# Patient Record
Sex: Female | Born: 1985 | Race: Black or African American | Hispanic: No | Marital: Single | Smoking: Former smoker
Health system: Southern US, Community
[De-identification: ages and names within clinical notes are randomized; demographics above are authoritative.]

## PROBLEM LIST (undated history)

## (undated) DIAGNOSIS — G4730 Sleep apnea, unspecified: Secondary | ICD-10-CM

---
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
## 2014-03-20 ENCOUNTER — Emergency Department (HOSPITAL_COMMUNITY)
Admission: EM | Admit: 2014-03-20 | Discharge: 2014-03-20 | Disposition: A | Discharge status: Home / Self Care | Payer: Self-pay | Attending: Emergency Medicine | Admitting: Emergency Medicine

## 2014-03-20 ENCOUNTER — Emergency Department (HOSPITAL_COMMUNITY): Payer: Self-pay

## 2014-03-20 ENCOUNTER — Encounter (HOSPITAL_COMMUNITY): Payer: Self-pay | Admitting: Emergency Medicine

## 2014-03-20 DIAGNOSIS — J989 Respiratory disorder, unspecified: Secondary | ICD-10-CM

## 2014-03-20 DIAGNOSIS — Z720 Tobacco use: Secondary | ICD-10-CM | POA: Insufficient documentation

## 2014-03-20 DIAGNOSIS — R6889 Other general symptoms and signs: Secondary | ICD-10-CM

## 2014-03-20 DIAGNOSIS — J069 Acute upper respiratory infection, unspecified: Secondary | ICD-10-CM | POA: Insufficient documentation

## 2014-03-20 MED ORDER — HYDROCODONE-HOMATROPINE 5-1.5 MG/5ML PO SYRP: 5.0000 mL | ORAL_SOLUTION | Freq: Four times a day (QID) | ORAL | Status: DC | PRN

## 2014-03-20 MED ORDER — IBUPROFEN 800 MG PO TABS
800.0000 mg | ORAL_TABLET | Freq: Once | ORAL | Status: AC
  Administered 2014-03-20: 800 mg via ORAL
  Filled 2014-03-20: qty 1

## 2014-03-20 MED ORDER — HYDROCODONE-HOMATROPINE 5-1.5 MG/5ML PO SYRP
5.0000 mL | ORAL_SOLUTION | Freq: Once | ORAL | Status: AC
  Administered 2014-03-20: 5 mL via ORAL
  Filled 2014-03-20: qty 5

## 2014-03-20 MED ORDER — ALBUTEROL SULFATE HFA 108 (90 BASE) MCG/ACT IN AERS
2.0000 | INHALATION_SPRAY | Freq: Once | RESPIRATORY_TRACT | Status: AC
  Administered 2014-03-20: 2 via RESPIRATORY_TRACT
  Filled 2014-03-20: qty 6.7

## 2014-03-20 MED ORDER — ACETAMINOPHEN 500 MG PO TABS: 500.0000 mg | ORAL_TABLET | Freq: Four times a day (QID) | ORAL | Status: DC | PRN

## 2014-03-20 MED ORDER — IBUPROFEN 600 MG PO TABS: 600.0000 mg | ORAL_TABLET | Freq: Four times a day (QID) | ORAL | Status: DC | PRN

## 2014-03-20 NOTE — ED Notes (Signed)
Pt reports cold like symptoms x 2 weeks associated with fever and chills. Pt reports productive cough with green sputum. Pt last took tylenol at 0045 for fever.

## 2014-03-20 NOTE — ED Provider Notes (Signed)
CSN: [id number]     Arrival date & time 03/20/14  0250 History   First MD Initiated Contact with Patient 03/20/14 0314     Chief Complaint  Patient presents with  . Nasal Congestion    The patient said she started having cold like symptoms two weeks ago.  The patient does have a productive cough.  . Cough  . Fever     (Consider location/radiation/quality/duration/timing/severity/associated sxs/prior Treatment) HPI Comments: Patient is a 28 yo F past medical history significant for tobacco abuse presenting to the emergency department for 2 weeks of intermittent fevers, chills, productive cough, nasal congestion, rhinorrhea. Max Temperature is 101.3 Fahrenheit. She has tried nighttime cough and cold medication and Tylenol for her symptoms with minimal improvement. Denies any sick contacts. Denies any nausea, vomiting, diarrhea, abdominal pain.  Patient is a [age] y.o. female presenting with cough and fever.  Cough Associated symptoms: chills, fever and rhinorrhea   Fever Associated symptoms: chills, congestion, cough and rhinorrhea   Associated symptoms: no nausea and no vomiting     History reviewed. No pertinent past medical history. Past Surgical History  Procedure Laterality Date  . Breast lumpectomy     History reviewed. No pertinent family history. History  Substance Use Topics  . Smoking status: Current Some Day Smoker -- 0.50 packs/day  . Smokeless tobacco: Never Used  . Alcohol Use: Yes   OB History    No data available     Review of Systems  Constitutional: Positive for fever and chills.  HENT: Positive for congestion and rhinorrhea.   Respiratory: Positive for cough.   Gastrointestinal: Negative for nausea, vomiting and abdominal pain.  All other systems reviewed and are negative.     Allergies  Review of patient's allergies indicates no known allergies.  Home Medications   Prior to Admission medications   Medication Sig Start Date End Date Taking?  Authorizing Provider  acetaminophen (TYLENOL) 325 MG tablet Take 650 mg by mouth every 6 (six) hours as needed for mild pain.   Yes Historical Provider, MD   BP 127/86 mmHg  Pulse 88  Temp(Src) 98.4 F (36.9 C) (Oral)  Resp 16  SpO2 99%  LMP 03/13/2014 Physical Exam  Constitutional: She is oriented to person, place, and time. She appears well-developed and well-nourished. No distress.  HENT:  Head: Normocephalic and atraumatic.  Right Ear: Hearing, tympanic membrane, external ear and ear canal normal.  Left Ear: Hearing, tympanic membrane, external ear and ear canal normal.  Nose: Nose normal.  Mouth/Throat: Oropharynx is clear and moist. No oropharyngeal exudate.  Eyes: Conjunctivae are normal.  Neck: Normal range of motion. Neck supple.  Cardiovascular: Normal rate, regular rhythm and normal heart sounds.   Pulmonary/Chest: Effort normal and breath sounds normal. No respiratory distress. She has no wheezes. She has no rales. She exhibits no tenderness.  Abdominal: Soft. There is no tenderness.  Musculoskeletal: Normal range of motion.  Lymphadenopathy:    She has no cervical adenopathy.  Neurological: She is alert and oriented to person, place, and time.  Skin: Skin is warm and dry. She is not diaphoretic.  Psychiatric: She has a normal mood and affect.  Nursing note and vitals reviewed.   ED Course  Procedures (including critical care time) Medications  ibuprofen (ADVIL,MOTRIN) tablet 800 mg (800 mg Oral Given 03/20/14 0320)    Labs Review Labs Reviewed - No data to display  Imaging Review Dg Chest 2 View  03/20/2014   CLINICAL DATA:  Congestion, fever,  cough and headache.  EXAM: CHEST  2 VIEW  COMPARISON:  None.  FINDINGS: Heart size and mediastinal contours are within normal limits. Both lungs are clear. Visualized skeletal structures are unremarkable.  IMPRESSION: Negative exam.   Electronically Signed   By: Drusilla Kannerhomas  Dalessio M.D.   On: 03/20/2014 03:45     EKG  Interpretation None      MDM   Final diagnoses:  Respiratory illness    Filed Vitals:   03/20/14 0413  BP: 112/83  Pulse: 81  Temp:   Resp:     Afebrile, NAD, non-toxic appearing, AAOx4.  Patient with symptoms consistent with influenza.  Vitals are stable, low-grade fever at home.  No signs of dehydration, tolerating PO's.  Lungs are clear. Due to patient's presentation and physical exam a chest x-ray was not ordered bc likely diagnosis of flu.  Discussed the cost versus benefit of Tamiflu treatment with the patient.  The patient understands that symptoms are greater than the recommended 24-48 hour window of treatment.  Patient will be discharged with instructions to orally hydrate, rest, and use over-the-counter medications such as anti-inflammatories ibuprofen and Aleve for muscle aches and Tylenol for fever.  Patient will also be given a cough suppressant. Return precautions discussed. Patient is agreeable to plan. Patient stable at time of discharge.     Jeannetta EllisJennifer L Corri Delapaz, PA-C 03/20/14 [phone]  Shon Batonourtney F Horton, MD 03/20/14 [id number]

## 2014-03-20 NOTE — Discharge Instructions (Signed)
Please follow up with your primary care physician in 1-2 days. If you do not have one please call the [facility] number listed above. Please alternate between Motrin and Tylenol every three hours for fevers and pain. Please use your inhaler 1-2 puffs every 4-6 hours to help with cough or chest tightness. Please take Hycodan as prescribed. Please be aware this medication may make you sleepy or drowsy and he should not drive after taking it. Please read all discharge instructions and return precautions.   Influenza Influenza ("the flu") is a viral infection of the respiratory tract. It occurs more often in winter months because people spend more time in close contact with one another. Influenza can make you feel very sick. Influenza easily spreads from person to person (contagious). CAUSES  Influenza is caused by a virus that infects the respiratory tract. You can catch the virus by breathing in droplets from an infected person's cough or sneeze. You can also catch the virus by touching something that was recently contaminated with the virus and then touching your mouth, nose, or eyes. RISKS AND COMPLICATIONS You may be at risk for a more severe case of influenza if you smoke cigarettes, have diabetes, have chronic heart disease (such as heart failure) or lung disease (such as asthma), or if you have a weakened immune system. Elderly people and pregnant women are also at risk for more serious infections. The most common problem of influenza is a lung infection (pneumonia). Sometimes, this problem can require emergency medical care and may be life threatening. SIGNS AND SYMPTOMS  Symptoms typically last 4 to 10 days and may include:  Fever.  Chills.  Headache, body aches, and muscle aches.  Sore throat.  Chest discomfort and cough.  Poor appetite.  Weakness or feeling tired.  Dizziness.  Nausea or vomiting. DIAGNOSIS  Diagnosis of influenza is often made based on your  history and a physical exam. A nose or throat swab test can be done to confirm the diagnosis. TREATMENT  In mild cases, influenza goes away on its own. Treatment is directed at relieving symptoms. For more severe cases, your health care provider may prescribe antiviral medicines to shorten the sickness. Antibiotic medicines are not effective because the infection is caused by a virus, not by bacteria. HOME CARE INSTRUCTIONS  Take medicines only as directed by your health care provider.  Use a cool mist humidifier to make breathing easier.  Get plenty of rest until your temperature returns to normal. This usually takes 3 to 4 days.  Drink enough fluid to keep your urine clear or pale yellow.  Cover yourmouth and nosewhen coughing or sneezing,and wash your handswellto prevent thevirusfrom spreading.  Stay homefromwork orschool untilthe fever is gonefor at least 451full day. PREVENTION  An annual influenza vaccination (flu shot) is the best way to avoid getting influenza. An annual flu shot is now routinely recommended for all adults in the [place] SEEK MEDICAL CARE IF:  You experiencechest pain, yourcough worsens,or you producemore mucus.  Youhave nausea,vomiting, ordiarrhea.  Your fever returns or gets worse. SEEK IMMEDIATE MEDICAL CARE IF:  You havetrouble breathing, you become short of breath,or your skin ornails becomebluish.  You have severe painor stiffnessin the neck.  You develop a sudden headache, or pain in the face or ear.  You have nausea or vomiting that you cannot control. MAKE SURE YOU:   Understand these instructions.  Will watch your condition.  Will get help right away if you are  not doing well or get worse. Document Released: 04/23/2000 Document Revised: 09/10/2013 Document Reviewed: 07/26/2011 [facility] Patient Information 2015 [employer] [place]. This information is not intended to replace advice given to you by your health care provider.  Make sure you discuss any questions you have with your health care provider.

## 2014-03-20 NOTE — ED Notes (Signed)
The patient said she started having cold like symptoms two weeks ago.  The patient does have a productive cough.  She says she has  green, thick sputum when she coughs.  She did take nighttime cold medication and tylenol for a fever of 101.3.  Her fever has come down to 98.4.  She is here to be evaluated.

## 2014-04-30 ENCOUNTER — Emergency Department (HOSPITAL_COMMUNITY): Payer: No Typology Code available for payment source

## 2014-04-30 ENCOUNTER — Encounter (HOSPITAL_COMMUNITY): Payer: Self-pay | Admitting: Emergency Medicine

## 2014-04-30 ENCOUNTER — Emergency Department (HOSPITAL_COMMUNITY)
Admission: EM | Admit: 2014-04-30 | Discharge: 2014-04-30 | Disposition: A | Discharge status: Home / Self Care | Payer: Self-pay | Attending: Emergency Medicine | Admitting: Emergency Medicine

## 2014-04-30 ENCOUNTER — Emergency Department (HOSPITAL_COMMUNITY): Payer: Self-pay

## 2014-04-30 DIAGNOSIS — Z79899 Other long term (current) drug therapy: Secondary | ICD-10-CM | POA: Insufficient documentation

## 2014-04-30 DIAGNOSIS — Z720 Tobacco use: Secondary | ICD-10-CM | POA: Insufficient documentation

## 2014-04-30 DIAGNOSIS — S3992XA Unspecified injury of lower back, initial encounter: Secondary | ICD-10-CM | POA: Insufficient documentation

## 2014-04-30 DIAGNOSIS — Z791 Long term (current) use of non-steroidal anti-inflammatories (NSAID): Secondary | ICD-10-CM | POA: Insufficient documentation

## 2014-04-30 DIAGNOSIS — Y92410 Unspecified street and highway as the place of occurrence of the external cause: Secondary | ICD-10-CM | POA: Insufficient documentation

## 2014-04-30 DIAGNOSIS — M6281 Muscle weakness (generalized): Secondary | ICD-10-CM | POA: Insufficient documentation

## 2014-04-30 DIAGNOSIS — Y9389 Activity, other specified: Secondary | ICD-10-CM | POA: Insufficient documentation

## 2014-04-30 DIAGNOSIS — Y998 Other external cause status: Secondary | ICD-10-CM | POA: Insufficient documentation

## 2014-04-30 DIAGNOSIS — M549 Dorsalgia, unspecified: Secondary | ICD-10-CM

## 2014-04-30 LAB — I-STAT BETA HCG BLOOD, ED (MC, WL, AP ONLY): I-stat hCG, quantitative: <5 m[IU]/mL (ref <5)

## 2014-04-30 MED ORDER — MORPHINE SULFATE 4 MG/ML IJ SOLN
4.0000 mg | Freq: Once | INTRAMUSCULAR | Status: AC
  Administered 2014-04-30: 4 mg via INTRAVENOUS
  Filled 2014-04-30: qty 1

## 2014-04-30 MED ORDER — HYDROCODONE-ACETAMINOPHEN 5-325 MG PO TABS: 1.0000 | ORAL_TABLET | ORAL | Status: DC | PRN

## 2014-04-30 MED ORDER — CYCLOBENZAPRINE HCL 10 MG PO TABS: 10.0000 mg | ORAL_TABLET | Freq: Two times a day (BID) | ORAL | Status: DC | PRN

## 2014-04-30 MED ORDER — NAPROXEN 500 MG PO TABS: 500.0000 mg | ORAL_TABLET | Freq: Two times a day (BID) | ORAL | Status: DC

## 2014-04-30 MED ORDER — ONDANSETRON HCL 4 MG/2ML IJ SOLN
4.0000 mg | Freq: Once | INTRAMUSCULAR | Status: AC
  Administered 2014-04-30: 4 mg via INTRAVENOUS
  Filled 2014-04-30: qty 2

## 2014-04-30 NOTE — ED Notes (Signed)
Patient transported to X-ray without distress; pain easing off. Glass removed from under pt's back.
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
## 2014-04-30 NOTE — ED Notes (Signed)
Pt walked to restroom without distress or assistance.

## 2014-04-30 NOTE — ED Notes (Signed)
Pt resting in bed sitting up.
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
## 2014-04-30 NOTE — ED Notes (Signed)
Pt returned from scans without distress; pain improved.
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
## 2014-04-30 NOTE — ED Notes (Signed)
PT ambulated with baseline gait; VSS; A&Ox3; no signs of distress; respirations even and unlabored; skin warm and dry; no questions upon discharge.  

## 2014-04-30 NOTE — ED Notes (Signed)
PA at bedside.

## 2014-12-19 ENCOUNTER — Encounter (HOSPITAL_BASED_OUTPATIENT_CLINIC_OR_DEPARTMENT_OTHER): Payer: Self-pay | Admitting: *Deleted

## 2014-12-19 ENCOUNTER — Emergency Department (HOSPITAL_BASED_OUTPATIENT_CLINIC_OR_DEPARTMENT_OTHER)
Admission: EM | Admit: 2014-12-19 | Discharge: 2014-12-19 | Disposition: A | Discharge status: Home / Self Care | Payer: Self-pay | Attending: Emergency Medicine | Admitting: Emergency Medicine

## 2014-12-19 DIAGNOSIS — R519 Headache, unspecified: Secondary | ICD-10-CM

## 2014-12-19 DIAGNOSIS — Z791 Long term (current) use of non-steroidal anti-inflammatories (NSAID): Secondary | ICD-10-CM | POA: Insufficient documentation

## 2014-12-19 DIAGNOSIS — R51 Headache: Secondary | ICD-10-CM | POA: Insufficient documentation

## 2014-12-19 DIAGNOSIS — Z720 Tobacco use: Secondary | ICD-10-CM | POA: Insufficient documentation

## 2014-12-19 MED ORDER — KETOROLAC TROMETHAMINE 30 MG/ML IJ SOLN
30.0000 mg | Freq: Once | INTRAMUSCULAR | Status: AC
  Administered 2014-12-19: 30 mg via INTRAVENOUS
  Filled 2014-12-19: qty 1

## 2014-12-19 MED ORDER — LORAZEPAM 2 MG/ML IJ SOLN
1.0000 mg | Freq: Once | INTRAMUSCULAR | Status: DC
  Filled 2014-12-19: qty 1

## 2014-12-19 MED ORDER — DIPHENHYDRAMINE HCL 50 MG/ML IJ SOLN
25.0000 mg | Freq: Once | INTRAMUSCULAR | Status: AC
  Administered 2014-12-19: 25 mg via INTRAVENOUS
  Filled 2014-12-19: qty 1

## 2014-12-19 MED ORDER — SODIUM CHLORIDE 0.9 % IV BOLUS (SEPSIS)
1000.0000 mL | Freq: Once | INTRAVENOUS | Status: AC
  Administered 2014-12-19: 1000 mL via INTRAVENOUS

## 2014-12-19 MED ORDER — PROCHLORPERAZINE EDISYLATE 5 MG/ML IJ SOLN
10.0000 mg | Freq: Once | INTRAMUSCULAR | Status: AC
  Administered 2014-12-19: 10 mg via INTRAVENOUS
  Filled 2014-12-19: qty 2

## 2014-12-19 NOTE — ED Notes (Signed)
Patient states she has had a migraine headache since yesterday.  States she has a history of the same and feels like her typical migraine.

## 2014-12-19 NOTE — ED Notes (Signed)
D/c home with friend to drive- note for work given

## 2014-12-19 NOTE — Discharge Instructions (Signed)
You are having a headache. No specific cause was found today for your headache. It may have been a migraine or other cause of headache. Stress, anxiety, fatigue, and depression are common triggers for headaches. Your headache today does not appear to be life-threatening or require hospitalization, but often the exact cause of headaches is not determined in the emergency department. Therefore, follow-up with your doctor is very important to find out what may have caused your headache, and whether or not you need any further diagnostic testing or treatment. Sometimes headaches can appear benign (not harmful), but then more serious symptoms can develop which should prompt an immediate re-evaluation by your doctor or the emergency department. °SEEK MEDICAL ATTENTION IF: °You develop possible problems with medications prescribed.  °The medications don't resolve your headache, if it recurs , or if you have multiple episodes of vomiting or can't take fluids. °You have a change from the usual headache. °RETURN IMMEDIATELY IF you develop a sudden, severe headache or confusion, become poorly responsive or faint, develop a fever above 100.4F or problem breathing, have a change in speech, vision, swallowing, or understanding, or develop new weakness, numbness, tingling, incoordination, or have a seizure. ° ° °Migraine Headache °A migraine headache is an intense, throbbing pain on one or both sides of your head. A migraine can last for 30 minutes to several hours. °CAUSES  °The exact cause of a migraine headache is not always known. However, a migraine may be caused when nerves in the brain become irritated and release chemicals that cause inflammation. This causes pain. °Certain things may also trigger migraines, such as: °· Alcohol. °· Smoking. °· Stress. °· Menstruation. °· Aged cheeses. °· Foods or drinks that contain nitrates, glutamate, aspartame, or tyramine. °· Lack of  sleep. °· Chocolate. °· Caffeine. °· Hunger. °· Physical exertion. °· Fatigue. °· Medicines used to treat chest pain (nitroglycerine), birth control pills, estrogen, and some blood pressure medicines. °SIGNS AND SYMPTOMS °· Pain on one or both sides of your head. °· Pulsating or throbbing pain. °· Severe pain that prevents daily activities. °· Pain that is aggravated by any physical activity. °· Nausea, vomiting, or both. °· Dizziness. °· Pain with exposure to bright lights, loud noises, or activity. °· General sensitivity to bright lights, loud noises, or smells. °Before you get a migraine, you may get warning signs that a migraine is coming (aura). An aura may include: °· Seeing flashing lights. °· Seeing bright spots, halos, or zigzag lines. °· Having tunnel vision or blurred vision. °· Having feelings of numbness or tingling. °· Having trouble talking. °· Having muscle weakness. °DIAGNOSIS  °A migraine headache is often diagnosed based on: °· Symptoms. °· Physical exam. °· A CT scan or MRI of your head. These imaging tests cannot diagnose migraines, but they can help rule out other causes of headaches. °TREATMENT °Medicines may be given for pain and nausea. Medicines can also be given to help prevent recurrent migraines.  °HOME CARE INSTRUCTIONS °· Only take over-the-counter or prescription medicines for pain or discomfort as directed by your health care provider. The use of long-term narcotics is not recommended. °· Lie down in a dark, quiet room when you have a migraine. °· Keep a journal to find out what may trigger your migraine headaches. For example, write down: °¨ What you eat and drink. °¨ How much sleep you get. °¨ Any change to your diet or medicines. °· Limit alcohol consumption. °· Quit smoking if you smoke. °· Get 7-9 hours   of sleep, or as recommended by your health care provider. °· Limit stress. °· Keep lights dim if bright lights bother you and make your migraines worse. °SEEK IMMEDIATE MEDICAL  CARE IF:  °· Your migraine becomes severe. °· You have a fever. °· You have a stiff neck. °· You have vision loss. °· You have muscular weakness or loss of muscle control. °· You start losing your balance or have trouble walking. °· You feel faint or pass out. °· You have severe symptoms that are different from your first symptoms. °MAKE SURE YOU:  °· Understand these instructions. °· Will watch your condition. °· Will get help right away if you are not doing well or get worse. °Document Released: 04/26/2005 Document Revised: 09/10/2013 Document Reviewed: 01/01/2013 °[employer]® Patient Information ©2015 [employer]. This information is not intended to replace advice given to you by your health care provider. Make sure you discuss any questions you have with your health care provider. ° °

## 2014-12-19 NOTE — ED Provider Notes (Signed)
CSN: [id number]     Arrival date & time 12/19/14  0940 History   First MD Initiated Contact with Patient 12/19/14 1207     Chief Complaint  Patient presents with  . Migraine     (Consider location/radiation/quality/duration/timing/severity/associated sxs/prior Treatment) HPI  SUBJECTIVE:  Deborah Norman is a 29 y.o. female who complains of migraine headache for 2 day(s). She has a well established history of recurrent migraines. Description of pain: throbbing pain, unilateral. Associated symptoms: light sensitivity and nausea. Patient has already taken otc meds for this headache without relief. There are no associated abnormal neurological symptoms such as TIA's, loss of balance, loss of vision or speech, numbness or weakness on review. Past neurological history: negative for stroke, MS, epilepsy, or brain tumor.   Current Facility-Administered Medications  Medication Dose Route Frequency Provider Last Rate Last Dose  . LORazepam (ATIVAN) injection 1 mg  1 mg Intravenous Once Arthor Captain, PA-C   1 mg at 12/19/14 1302   Current Outpatient Prescriptions  Medication Sig Dispense Refill  . acetaminophen (TYLENOL) 500 MG tablet Take 1 tablet (500 mg total) by mouth every 6 (six) hours as needed. 30 tablet 0  . cyclobenzaprine (FLEXERIL) 10 MG tablet Take 1 tablet (10 mg total) by mouth 2 (two) times daily as needed for muscle spasms. 20 tablet 0  . HYDROcodone-acetaminophen (NORCO) 5-325 MG per tablet Take 1 tablet by mouth every 4 (four) hours as needed. 10 tablet 0  . HYDROcodone-homatropine (HYCODAN) 5-1.5 MG/5ML syrup Take 5 mLs by mouth every 6 (six) hours as needed for cough. (Patient not taking: Reported on 04/30/2014) 120 mL 0  . ibuprofen (ADVIL,MOTRIN) 600 MG tablet Take 1 tablet (600 mg total) by mouth every 6 (six) hours as needed. 30 tablet 0  . naproxen (NAPROSYN) 500 MG tablet Take 1 tablet (500 mg total) by mouth 2 (two) times daily with a meal. 30 tablet 0      History  reviewed. No pertinent past medical history. Past Surgical History  Procedure Laterality Date  . Breast lumpectomy     No family history on file. Social History  Substance Use Topics  . Smoking status: Current Some Day Smoker -- 0.50 packs/day  . Smokeless tobacco: Never Used  . Alcohol Use: Yes   OB History    No data available     Review of Systems  Ten systems reviewed and are negative for acute change, except as noted in the HPI.    Allergies  Review of patient's allergies indicates no known allergies.  Home Medications   Prior to Admission medications   Medication Sig Start Date End Date Taking? Authorizing Provider  acetaminophen (TYLENOL) 500 MG tablet Take 1 tablet (500 mg total) by mouth every 6 (six) hours as needed. 03/20/14  Yes Jennifer Piepenbrink, PA-C  cyclobenzaprine (FLEXERIL) 10 MG tablet Take 1 tablet (10 mg total) by mouth 2 (two) times daily as needed for muscle spasms. 04/30/14   Arthor Captain, PA-C  HYDROcodone-acetaminophen (NORCO) 5-325 MG per tablet Take 1 tablet by mouth every 4 (four) hours as needed. 04/30/14   Arthor Captain, PA-C  HYDROcodone-homatropine (HYCODAN) 5-1.5 MG/5ML syrup Take 5 mLs by mouth every 6 (six) hours as needed for cough. Patient not taking: Reported on 04/30/2014 03/20/14   Francee Piccolo, PA-C  ibuprofen (ADVIL,MOTRIN) 600 MG tablet Take 1 tablet (600 mg total) by mouth every 6 (six) hours as needed. 03/20/14   Jennifer Piepenbrink, PA-C  naproxen (NAPROSYN) 500 MG tablet Take 1 tablet (  500 mg total) by mouth 2 (two) times daily with a meal. 04/30/14   Arthor Captain, PA-C   BP 132/82 mmHg  Pulse 68  Temp(Src) 98.2 F (36.8 C) (Oral)  Resp 16  Ht 5\' 4"  (1.626 m)  Wt 235 lb (106.595 kg)  BMI 40.32 kg/m2  SpO2 100%  LMP 12/05/2014 (Exact Date) Physical Exam  Constitutional: She is oriented to person, place, and time. She appears well-developed and well-nourished. No distress.  HENT:  Head: Normocephalic and  atraumatic.  Mouth/Throat: Oropharynx is clear and moist.  Eyes: Conjunctivae and EOM are normal. Pupils are equal, round, and reactive to light. No scleral icterus.  No horizontal, vertical or rotational nystagmus  Neck: Normal range of motion. Neck supple.  Full active and passive ROM without pain No midline or paraspinal tenderness No nuchal rigidity or meningeal signs  Cardiovascular: Normal rate, regular rhythm and intact distal pulses.   Pulmonary/Chest: Effort normal and breath sounds normal. No respiratory distress. She has no wheezes. She has no rales.  Abdominal: Soft. Bowel sounds are normal. There is no tenderness. There is no rebound and no guarding.  Musculoskeletal: Normal range of motion.  Lymphadenopathy:    She has no cervical adenopathy.  Neurological: She is alert and oriented to person, place, and time. She has normal reflexes. No cranial nerve deficit. She exhibits normal muscle tone. Coordination normal.  Mental Status:  Alert, oriented, thought content appropriate. Speech fluent without evidence of aphasia. Able to follow 2 step commands without difficulty.  Cranial Nerves:  II:  Peripheral visual fields grossly normal, pupils equal, round, reactive to light III,IV, VI: ptosis not present, extra-ocular motions intact bilaterally  V,VII: smile symmetric, facial light touch sensation equal VIII: hearing grossly normal bilaterally  IX,X: midline uvula rise  XI: bilateral shoulder shrug equal and strong XII: midline tongue extension  Motor:  5/5 in upper and lower extremities bilaterally including strong and equal grip strength and dorsiflexion/plantar flexion Sensory: Pinprick and light touch normal in all extremities.  Deep Tendon Reflexes: 2+ and symmetric  Cerebellar: normal finger-to-nose with bilateral upper extremities Gait: normal gait and balance CV: distal pulses palpable throughout   Skin: Skin is warm and dry. No rash noted. She is not diaphoretic.   Psychiatric: She has a normal mood and affect. Her behavior is normal. Judgment and thought content normal.  Nursing note and vitals reviewed.   ED Course  Procedures (including critical care time) Labs Review Labs Reviewed - No data to display  Imaging Review No results found.   EKG Interpretation None      MDM   Final diagnoses:  Bad headache    Pt HA treated and improved while in ED.  Presentation is like pts typical HA and non concerning for [facility], ICH, Meningitis, or temporal arteritis. Pt is afebrile with no focal neuro deficits, nuchal rigidity, or change in vision. Pt is to follow up with PCP to discuss prophylactic medication. Pt verbalizes understanding and is agreeable with plan to dc.      Arthor Captain, PA-C 12/19/14 1336  Blake Divine, MD 12/19/14 [phone]
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
## 2017-08-09 ENCOUNTER — Ambulatory Visit: Payer: BC Managed Care – PPO | Admitting: Diagnostic Neuroimaging

## 2017-08-09 ENCOUNTER — Encounter: Payer: Self-pay | Admitting: Diagnostic Neuroimaging

## 2017-08-09 VITALS — BP 137/91 | HR 78 | Ht 65.0 in | Wt 246.8 lb

## 2017-08-09 DIAGNOSIS — G4489 Other headache syndrome: Secondary | ICD-10-CM | POA: Diagnosis not present

## 2017-08-09 DIAGNOSIS — H4710 Unspecified papilledema: Secondary | ICD-10-CM

## 2017-08-09 MED ORDER — ALPRAZOLAM 0.5 MG PO TABS: 0.5000 mg | ORAL_TABLET | ORAL | 0 refills | Status: DC | PRN

## 2017-08-09 NOTE — Progress Notes (Signed)
[facility]  PATIENT: Deborah Norman DOB: [birth year]  REFERRING CLINICIAN: Lewis Moccasin, OD HISTORY FROM: patient and chart review  REASON FOR VISIT: new consult    HISTORICAL  CHIEF COMPLAINT:  Chief Complaint  Patient presents with  . Benign intracranial HTN    rm 7, New Pt, friend- [place]    HISTORY OF PRESENT ILLNESS:   32 year old female here for evaluation of possible pseudotumor cerebri.  Patient presented to eye doctor for routine annual exam.  Due to slight optic nerve head elevation without frank papilledema, patient return for OCT testing.  Apparently this showed abnormality concerning for pseudotumor cerebri and therefore patient was referred here for further evaluation.  Patient does have some mild intermittent headaches, 2-3 times per month, with nausea, vomiting, photophobia and phonophobia.  Headaches can last 1-3 days at a time.  Patient had evaluation in 2014 for possible "migraine headaches".  Patient has had some intermittent ringing in the ears.  She has also had 30 pound weight gain in the past 5-6 months.  No numbness or tingling.  No balance or gait difficulty.  No slurred speech.   REVIEW OF SYSTEMS: Full 14 system review of systems performed and negative with exception of: Weight gain eye pain headache snoring dizziness anxiety increased thirst runny nose cough swelling in feet.  ALLERGIES: No Known Allergies  HOME MEDICATIONS: Outpatient Medications Prior to Visit  Medication Sig Dispense Refill  . acetaminophen (TYLENOL) 500 MG tablet Take 1 tablet (500 mg total) by mouth every 6 (six) hours as needed. 30 tablet 0  . cyclobenzaprine (FLEXERIL) 10 MG tablet Take 1 tablet (10 mg total) by mouth 2 (two) times daily as needed for muscle spasms. 20 tablet 0  . ibuprofen (ADVIL,MOTRIN) 600 MG tablet Take 1 tablet (600 mg total) by mouth every 6 (six) hours as needed. 30 tablet 0  . naproxen (NAPROSYN) 500 MG tablet Take 1 tablet  (500 mg total) by mouth 2 (two) times daily with a meal. 30 tablet 0  . HYDROcodone-acetaminophen (NORCO) 5-325 MG per tablet Take 1 tablet by mouth every 4 (four) hours as needed. 10 tablet 0  . HYDROcodone-homatropine (HYCODAN) 5-1.5 MG/5ML syrup Take 5 mLs by mouth every 6 (six) hours as needed for cough. (Patient not taking: Reported on 04/30/2014) 120 mL 0   No facility-administered medications prior to visit.     PAST MEDICAL HISTORY: No past medical history on file.  PAST SURGICAL HISTORY: Past Surgical History:  Procedure Laterality Date  . BREAST LUMPECTOMY Right 2003   benign    FAMILY HISTORY: Family History  Problem Relation Age of Onset  . Heart disease Father   . Hypertension Father   . Depression Father   . Asthma Sister     SOCIAL HISTORY:  Social History   Socioeconomic History  . Marital status: Single    Spouse name: Not on file  . Number of children: Not on file  . Years of education: Not on file  . Highest education level: Not on file  Occupational History  . Not on file  Social Needs  . Financial resource strain: Not on file  . Food insecurity:    Worry: Not on file    Inability: Not on file  . Transportation needs:    Medical: Not on file    Non-medical: Not on file  Tobacco Use  . Smoking status: Current Some Day Smoker    Packs/day: 0.50  . Smokeless tobacco: Never Used  .  Tobacco comment: 08/09/17 1/2 pack > month  Substance and Sexual Activity  . Alcohol use: Yes    Comment: socially  . Drug use: No  . Sexual activity: Never  Lifestyle  . Physical activity:    Days per week: Not on file    Minutes per session: Not on file  . Stress: Not on file  Relationships  . Social connections:    Talks on phone: Not on file    Gets together: Not on file    Attends religious service: Not on file    Active member of club or organization: Not on file    Attends meetings of clubs or organizations: Not on file    Relationship status: Not on  file  . Intimate partner violence:    Fear of current or ex partner: Not on file    Emotionally abused: Not on file    Physically abused: Not on file    Forced sexual activity: Not on file  Other Topics Concern  . Not on file  Social History Narrative   Radio broadcast assistant   Education- 18   Children- none   Caffeine - none     PHYSICAL EXAM  GENERAL EXAM/CONSTITUTIONAL: Vitals:  Vitals:   08/09/17 1550  BP: (!) 137/91  Pulse: 78  Weight: 246 lb 12.8 oz (111.9 kg)  Height: 5\' 5"  (1.651 m)     Body mass index is 41.07 kg/m.  Visual Acuity Screening   Right eye Left eye Both eyes  Without correction:     With correction: 20/30 20/30   Comments: Contact lenses    Patient is in no distress; well developed, nourished and groomed; neck is supple  CARDIOVASCULAR:  Examination of carotid arteries is normal; no carotid bruits  Regular rate and rhythm, no murmurs  Examination of peripheral vascular system by observation and palpation is normal  EYES:  Ophthalmoscopic exam of optic discs and posterior segments is normal; no papilledema or hemorrhages  MUSCULOSKELETAL:  Gait, strength, tone, movements noted in Neurologic exam below  NEUROLOGIC: MENTAL STATUS:  No flowsheet data found.  awake, alert, oriented to person, place and time  recent and remote memory intact  normal attention and concentration  language fluent, comprehension intact, naming intact,   fund of knowledge appropriate  CRANIAL NERVE:   2nd - no papilledema on fundoscopic exam  2nd, 3rd, 4th, 6th - pupils equal and reactive to light, visual fields full to confrontation, extraocular muscles intact, no nystagmus  5th - facial sensation symmetric  7th - facial strength symmetric  8th - hearing intact  9th - palate elevates symmetrically, uvula midline  11th - shoulder shrug symmetric  12th - tongue protrusion midline  MOTOR:   normal bulk and tone, full strength in the BUE,  BLE  SENSORY:   normal and symmetric to light touch, temperature, vibration  COORDINATION:   finger-nose-finger, fine finger movements normal  REFLEXES:   deep tendon reflexes TRACE and symmetric  GAIT/STATION:   narrow based gait    DIAGNOSTIC DATA (LABS, IMAGING, TESTING) - I reviewed patient records, labs, notes, testing and imaging myself where available.  No results found for: WBC, HGB, HCT, MCV, PLT No results found for: NA, K, CL, CO2, GLUCOSE, BUN, CREATININE, CALCIUM, PROT, ALBUMIN, AST, ALT, ALKPHOS, BILITOT, GFRNONAA, GFRAA No results found for: CHOL, HDL, LDLCALC, LDLDIRECT, TRIG, CHOLHDL No results found for: UJWJ1B No results found for: VITAMINB12 No results found for: TSH       ASSESSMENT AND  PLAN  32 y.o. year old female here with migraine headaches without aura, weight gain, now found to have possible papilledema.  Will proceed with further workup for idiopathic intracranial hypertension (pseudotumor cerebri).    Dx:  1. Papilledema   2. Other headache syndrome      PLAN:  HEADACHES / VISION CHANGES / ? PAPILLEDEMA - check MRI brain w/wo --> if negative, then may consider MRI orbits in future - then may consider LP to measure opening pressure - reviewed nutrition, exercise, weight loss  Orders Placed This Encounter  Procedures  . MR BRAIN W WO CONTRAST   Meds ordered this encounter  Medications  . ALPRAZolam (XANAX) 0.5 MG tablet    Sig: Take 1 tablet (0.5 mg total) by mouth as needed for anxiety (for sedation before MRI scan; take 1 hour before scan; may repeat 15 min before scan).    Dispense:  3 tablet    Refill:  0   Return pending test results.  I reviewed images, labs, notes, records myself. I summarized findings and reviewed with patient, for this high risk condition (papilledema, vision changes, headaches) requiring high complexity decision making.    Suanne MarkerVIKRAM R. Knowledge Escandon, MD 08/09/2017, 4:22 PM Certified in Neurology,  Neurophysiology and Neuroimaging  [facility] [address], [address] [place], [place] [address] [phone]

## 2017-08-09 NOTE — Patient Instructions (Signed)
-   check MRI brain 

## 2017-08-10 ENCOUNTER — Telehealth: Payer: Self-pay | Admitting: Diagnostic Neuroimaging

## 2017-08-10 NOTE — Telephone Encounter (Signed)
Noted  

## 2017-08-10 NOTE — Telephone Encounter (Signed)
Just an fyi I spoke tot he patient I informed her it would cost her about $1,328.85 because of her deductible not being met. I did offer the payment plan and I even told her when she came in to have the MRI she would have to pay at least $75.00 she informed me she is not going to do that.

## 2019-01-08 ENCOUNTER — Other Ambulatory Visit: Payer: Self-pay

## 2019-01-08 DIAGNOSIS — Z20822 Contact with and (suspected) exposure to covid-19: Secondary | ICD-10-CM

## 2019-01-09 LAB — NOVEL CORONAVIRUS, NAA: SARS-CoV-2, NAA: NOT DETECTED

## 2019-03-15 ENCOUNTER — Other Ambulatory Visit: Payer: Self-pay

## 2019-03-15 DIAGNOSIS — Z20822 Contact with and (suspected) exposure to covid-19: Secondary | ICD-10-CM

## 2019-03-17 LAB — NOVEL CORONAVIRUS, NAA: SARS-CoV-2, NAA: NOT DETECTED

## 2020-07-17 ENCOUNTER — Other Ambulatory Visit: Payer: Self-pay | Admitting: Surgery

## 2020-07-17 ENCOUNTER — Other Ambulatory Visit (HOSPITAL_COMMUNITY): Payer: Self-pay | Admitting: Surgery

## 2020-07-18 ENCOUNTER — Other Ambulatory Visit (HOSPITAL_BASED_OUTPATIENT_CLINIC_OR_DEPARTMENT_OTHER): Payer: Self-pay

## 2020-08-01 ENCOUNTER — Other Ambulatory Visit: Payer: Self-pay

## 2020-08-01 ENCOUNTER — Ambulatory Visit (HOSPITAL_COMMUNITY)
Admission: RE | Admit: 2020-08-01 | Discharge: 2020-08-01 | Disposition: A | Discharge status: Home / Self Care | Payer: BC Managed Care – PPO | Source: Ambulatory Visit | Attending: Surgery | Admitting: Surgery

## 2020-08-20 ENCOUNTER — Ambulatory Visit: Payer: Self-pay | Admitting: Skilled Nursing Facility1

## 2020-08-27 ENCOUNTER — Other Ambulatory Visit: Payer: Self-pay

## 2020-08-27 ENCOUNTER — Encounter: Payer: BC Managed Care – PPO | Attending: Surgery | Admitting: Skilled Nursing Facility1

## 2020-08-27 DIAGNOSIS — E669 Obesity, unspecified: Secondary | ICD-10-CM | POA: Insufficient documentation

## 2020-08-27 NOTE — Progress Notes (Signed)
Nutrition Assessment for Bariatric Surgery Medical Nutrition Therapy Appt Start Time: 7:40 am    End Time: 8:30am  Patient was seen on 08/27/20 for Pre-Operative Nutrition Assessment. Letter of approval faxed to [facility] Surgery bariatric surgery program coordinator on 08/27/20.   Referral stated Supervised Weight Loss (SWL) visits needed: 0  Not cleared at this time:  Pt to follow up for minimum of one more visit to assist pt with progressing through stages of change. RD advised pt that this follow up visit is not mandated through insurance. Pt verbalized agreement.  Planned surgery: sleeve Pt expectation of surgery: increase endurance Pt expectation of dietitian: none identified    NUTRITION ASSESSMENT   Anthropometrics  Start weight at [facility]: 254 lbs (date: 08/27/20)  Height: 64 in BMI: 42.93 kg/m2     Clinical  Medical hx: bowel movements every 2-3 days, breast lumpectomy at age 58 years Medications:  vitamin d, hydrochlorothiazide (3 times per week), and then as needed:  flexeril, advil, motrin, naproxen, Acetaminophen, xanax,    Labs: reviewed. none noted Notable signs/symptoms: no Any previous deficiencies? No  Micronutrient Nutrition Focused Physical Exam: Hair: No issues observed Eyes: No issues observed Mouth: No issues observed Neck: No issues observed Nails: No issues observed Skin: No issues observed  Lifestyle & Dietary Hx Pt reports previously taking phentermine which led to decreased appetite and weight loss.    Pt reports avoiding apples (with or without skin)  as they cause upset stomach and vomiting.  She can tolerate applesauce and apple juice.  Pt reports eating until uncomfortably full twice a week.  She likes to eat until she feels full.  Pt started hello fresh 3 weeks ago to helps her know what to cook.  Pt reports reducing wine intake from 1 bottle/day down to 2-4 glasses/week.  24-Hr Dietary Recall First Meal: Snack: 10:15am - 2  indivdual bags of chips, koolaid jammer Second Meal:  Snack:  Third Meal: (after 6pm) from [employer], chicken potstickers, crab cake bites Snack:  Beverages: water or sprite   Estimated Energy Needs Calories: 1500    NUTRITION DIAGNOSIS  Overweight/obesity ([place]-3.3) related to past poor dietary habits and physical inactivity as evidenced by patient w/ planned sleeve surgery following dietary guidelines for continued weight loss.    NUTRITION INTERVENTION  Nutrition counseling (C-1) and education (E-2) to facilitate bariatric surgery goals.   Pre-Op Goals Reviewed with the Patient . Track food and beverage intake on Ate app (pen and paper, MyFitness Pal, Baritastic app, etc.) . Make healthy food choices while monitoring portion sizes . Consume 3 meals per day or try to eat every 3-5 hours - start with adding  breakfast . Avoid concentrated sugars and fried foods . Keep sugar & fat in the single digits per serving on food labels . Practice CHEWING your food (aim for applesauce consistency) . Practice not drinking 15 minutes before, during, and 30 minutes after each meal and snack . Avoid all carbonated beverages (ex: soda, sparkling beverages)  . Limit caffeinated beverages (ex: coffee, tea, energy drinks) . Avoid all sugar-sweetened beverages (ex: regular soda, sports drinks)  . Avoid alcohol  . Aim for 64-100 ounces of FLUID daily (with at least half of fluid intake being plain water)  . Aim for at least 60-80 grams of PROTEIN daily . Look for a liquid protein source that contains ?15 g protein and ?5 g carbohydrate (ex: shakes, drinks, shots) . Make a list of non-food related  activities . Physical activity is an important part of a healthy lifestyle so keep it moving! The goal is to reach 150 minutes of exercise per week, including cardiovascular and weight baring activity. . Take multivitamin  *Goals that are bolded indicate the pt would like to start  working towards these  Handouts Provided Include  . Bariatric Surgery handouts (Nutrition Visits, Pre-Op Goals, Protein Shakes, Vitamins & Minerals)  Learning Style & Readiness for Change Teaching method utilized: Visual & Auditory  Demonstrated degree of understanding via: Teach Back  Readiness Level: contemplative Barriers to learning/adherence to lifestyle change: incongruencies  RD's Notes for Next Visit . Continue counseling on eating frequency, alcohol intake, quitting smoking     MONITORING & EVALUATION Dietary intake, weekly physical activity, body weight, and pre-op goals reached at next nutrition visit.    Next Steps  Patient is to follow up at [facility] for Pre-Op Class >2 weeks before surgery for further nutrition education.  Pt to return for 1 more supervised visit in two weeks.

## 2020-08-30 ENCOUNTER — Other Ambulatory Visit: Payer: Self-pay

## 2020-08-30 ENCOUNTER — Ambulatory Visit (HOSPITAL_BASED_OUTPATIENT_CLINIC_OR_DEPARTMENT_OTHER): Payer: BC Managed Care – PPO | Attending: Surgery | Admitting: Internal Medicine

## 2020-08-30 DIAGNOSIS — G4733 Obstructive sleep apnea (adult) (pediatric): Secondary | ICD-10-CM

## 2020-09-06 NOTE — Procedures (Signed)
   Patient Name: Deborah Norman, Deborah Norman Date: 08/30/2020 Gender: Female D.O.B: [birth year] Age (years): 35 Referring Provider: Ivar Drape MD Height (inches): 65 Interpreting Physician: Jetty Duhamel MD, ABSM Weight (lbs): 254 RPSGT: Peak, Robert BMI: 43 MRN: [id number] Neck Size: 16.50  CLINICAL INFORMATION Sleep Study Type: NPSG Indication for sleep study: OSA Epworth Sleepiness Score: 10  SLEEP STUDY TECHNIQUE As per the AASM Manual for the Scoring of Sleep and Associated Events v2.3 (April 2016) with a hypopnea requiring 4% desaturations.  The channels recorded and monitored were frontal, central and occipital EEG, electrooculogram (EOG), submentalis EMG (chin), nasal and oral airflow, thoracic and abdominal wall motion, anterior tibialis EMG, snore microphone, electrocardiogram, and pulse oximetry.  MEDICATIONS Medications self-administered by patient taken the night of the study : none reported  SLEEP ARCHITECTURE The study was initiated at 8:44:35 PM and ended at 5:01:59 AM.  Sleep onset time was 33.4 minutes and the sleep efficiency was 84.4%. The total sleep time was 420 minutes.  Stage REM latency was 172.0 minutes.  The patient spent 6.19% of the night in stage N1 sleep, 80.12% in stage N2 sleep, 6.43% in stage N3 and 7.3% in REM.  Alpha intrusion was absent.  Supine sleep was 27.50%.  RESPIRATORY PARAMETERS The overall apnea/hypopnea index (AHI) was 5.0 per hour. There were 19 total apneas, including 17 obstructive, 2 central and 0 mixed apneas. There were 16 hypopneas and 2 RERAs.  The AHI during Stage REM sleep was 51.1 per hour.  AHI while supine was 7.3 per hour.  The mean oxygen saturation was 95.29%. The minimum SpO2 during sleep was 85.00%.  moderate snoring was noted during this study.  CARDIAC DATA The 2 lead EKG demonstrated sinus rhythm. The mean heart rate was 66.32 beats per minute. Other EKG findings include: None.  LEG MOVEMENT  DATA The total PLMS were 0 with a resulting PLMS index of 0.00. Associated arousal with leg movement index was 0.0 .  IMPRESSIONS - Mild obstructive sleep apnea occurred during this study (AHI = 5.0/h). - No significant central sleep apnea occurred during this study (CAI = 0.3/h). - Mild oxygen desaturation was noted during this study (Min O2 = 85.00%). Mean O2 saturation 95.3%. - The patient snored with moderate snoring volume. - No cardiac abnormalities were noted during this study. - Clinically significant periodic limb movements did not occur during sleep. No significant associated arousals.  DIAGNOSIS - Obstructive Sleep Apnea (G47.33)  RECOMMENDATIONS - Borderline abnormal, minimal OSA. Treatment would be directed by symptoms and comorbidity. Conservative options may include observation, weight loss and sleep position off back. Other options, including CPAP, a fitted oral appliance, or ENT evaluation, would be based on clinical judgment. - Be careful with alcohol, sedatives and other CNS depressants that may worsen sleep apnea and disrupt normal sleep architecture. - Sleep hygiene should be reviewed to assess factors that may improve sleep quality. - Weight management and regular exercise should be initiated or continued if appropriate.  [Electronically signed] 09/06/2020 11:56 AM  Jetty Duhamel MD, ABSM Diplomate, American Board of Sleep Medicine   NPI: [id number]                         Jetty Duhamel Diplomate, American Board of Sleep Medicine  ELECTRONICALLY SIGNED ON:  09/06/2020, 11:51 AM [facility] PH: [phone]   FX: [phone] ACCREDITED BY THE AMERICAN ACADEMY OF SLEEP MEDICINE

## 2020-09-11 ENCOUNTER — Encounter: Payer: BC Managed Care – PPO | Admitting: Skilled Nursing Facility1

## 2020-09-11 ENCOUNTER — Encounter: Payer: BC Managed Care – PPO | Attending: Surgery | Admitting: Skilled Nursing Facility1

## 2020-09-11 DIAGNOSIS — E669 Obesity, unspecified: Secondary | ICD-10-CM | POA: Insufficient documentation

## 2020-09-11 NOTE — Progress Notes (Signed)
Supervised Weight Loss Visit Bariatric Nutrition Education  NUTRITION ASSESSMENT  Pt completed visits.   Pt has cleared nutrition requirements.   Appt conducted virtually pt identified by name and DOB; pt agreeable to visit type limitations   Anthropometrics  Start weight at [facility]: 254 lbs (date: 08/27/20)  Weight: virtual appt Height: 64 in BMI: 42.93 kg/m2     Clinical  Medical hx: bowel movements every 2-3 days, breast lumpectomy at age 35 years Medications:  vitamin d, hydrochlorothiazide (3 times per week), and then as needed:  flexeril, advil, motrin, naproxen, Acetaminophen, xanax,    Lifestyle & Dietary Hx  Pt states it has been Difficult to eat breakfast but thinks getting 3 meals in has been going pretty well. Pt states she has been tracking her food using bitesnap app. Pt states she has been working on the habit of taking the supplements daily.  Pt states she is working on eating early enough in the day to give a 3 hour window of not eating before bed.  Pt states she chugs her water now had will start working on sipping on water throughout the day. Pt states he is going to start hot yoga and has been walking at the [place] more throughout the week. Pt state she is worried she will not like the protein shakes: dietitian advised she try premier and fairlife.   Pt states she has not been drinking alcohol.   Estimated daily fluid intake: oz Supplements: multi and calcium Current average weekly physical activity: walking  24-Hr Dietary Recall First Meal: pineapples + chicken sausage Snack: Second Meal: chicken wrap from subway Snack: Third Meal: hello fresh meal or salad Snack:  Beverages: water, occasional juice  Estimated Energy Needs Calories: 1500   NUTRITION DIAGNOSIS  Overweight/obesity ([place]-3.3) related to past poor dietary habits and physical inactivity as evidenced by patient w/ planned sleeve gastrectomy surgery following dietary guidelines for continued  weight loss.   NUTRITION INTERVENTION  Nutrition counseling (C-1) and education (E-2) to facilitate bariatric surgery goals.  Pre-Op Goals Progress & New Goals . track food and beverage intake on Ate app (pen and  . Consume 3 meals per day or try to eat every 3-5 hours - start with adding  breakfast  Learning Style & Readiness for Change Teaching method utilized: Visual & Auditory  Demonstrated degree of understanding via: Teach Back  Readiness Level: Action Barriers to learning/adherence to lifestyle change: non identified    MONITORING & EVALUATION Dietary intake, weekly physical activity, body weight, and pre-op goals in 1 month.   Next Steps  Patient is to return to [facility] for pre-op class Pt has completed visits. No further supervised visits required/recomended

## 2020-12-12 ENCOUNTER — Ambulatory Visit: Payer: Self-pay | Admitting: Surgery

## 2021-01-06 NOTE — Progress Notes (Signed)
Attempted to obtain medical history via telephone, unable to reach at this time. I left a voicemail to return pre surgical testing department's phone call.  

## 2021-01-13 ENCOUNTER — Encounter (HOSPITAL_COMMUNITY)
Admission: RE | Disposition: A | Discharge status: Home / Self Care | Payer: Self-pay | Source: Home / Self Care | Attending: Surgery

## 2021-01-13 ENCOUNTER — Ambulatory Visit (HOSPITAL_COMMUNITY): Payer: BC Managed Care – PPO | Admitting: Registered Nurse

## 2021-01-13 ENCOUNTER — Encounter (HOSPITAL_COMMUNITY): Payer: Self-pay | Admitting: Surgery

## 2021-01-13 ENCOUNTER — Ambulatory Visit (HOSPITAL_COMMUNITY)
Admission: RE | Admit: 2021-01-13 | Discharge: 2021-01-13 | Disposition: A | Discharge status: Home / Self Care | Payer: BC Managed Care – PPO | Attending: Surgery | Admitting: Surgery

## 2021-01-13 ENCOUNTER — Other Ambulatory Visit: Payer: Self-pay

## 2021-01-13 DIAGNOSIS — K2950 Unspecified chronic gastritis without bleeding: Secondary | ICD-10-CM | POA: Insufficient documentation

## 2021-01-13 DIAGNOSIS — I10 Essential (primary) hypertension: Secondary | ICD-10-CM | POA: Diagnosis not present

## 2021-01-13 DIAGNOSIS — Z79899 Other long term (current) drug therapy: Secondary | ICD-10-CM | POA: Insufficient documentation

## 2021-01-13 DIAGNOSIS — K259 Gastric ulcer, unspecified as acute or chronic, without hemorrhage or perforation: Secondary | ICD-10-CM | POA: Diagnosis not present

## 2021-01-13 DIAGNOSIS — F17210 Nicotine dependence, cigarettes, uncomplicated: Secondary | ICD-10-CM | POA: Insufficient documentation

## 2021-01-13 DIAGNOSIS — R12 Heartburn: Secondary | ICD-10-CM | POA: Diagnosis present

## 2021-01-13 HISTORY — PX: BIOPSY: SHX5522

## 2021-01-13 HISTORY — PX: ESOPHAGOGASTRODUODENOSCOPY: SHX5428

## 2021-01-13 LAB — PREGNANCY, URINE: Preg Test, Ur: NEGATIVE

## 2021-01-13 SURGERY — EGD (ESOPHAGOGASTRODUODENOSCOPY)
Anesthesia: Monitor Anesthesia Care

## 2021-01-13 MED ORDER — PANTOPRAZOLE SODIUM 40 MG PO TBEC: 40.0000 mg | DELAYED_RELEASE_TABLET | Freq: Every day | ORAL | 3 refills | Status: AC

## 2021-01-13 MED ORDER — LIDOCAINE 2% (20 MG/ML) 5 ML SYRINGE
INTRAMUSCULAR | Status: DC | PRN
  Administered 2021-01-13: 100 mg via INTRAVENOUS

## 2021-01-13 MED ORDER — PROPOFOL 10 MG/ML IV BOLUS
INTRAVENOUS | Status: DC | PRN
  Administered 2021-01-13 (×2): 30 mg via INTRAVENOUS
  Administered 2021-01-13: 40 mg via INTRAVENOUS
  Administered 2021-01-13: 60 mg via INTRAVENOUS
  Administered 2021-01-13: 30 mg via INTRAVENOUS

## 2021-01-13 MED ORDER — LACTATED RINGERS IV SOLN
INTRAVENOUS | Status: DC
  Administered 2021-01-13: 1000 mL via INTRAVENOUS

## 2021-01-13 NOTE — Transfer of Care (Signed)
Immediate Anesthesia Transfer of Care Note  Patient: Irish Breisch  Procedure(s) Performed: ESOPHAGOGASTRODUODENOSCOPY (EGD) BIOPSY  Patient Location: PACU  Anesthesia Type:MAC  Level of Consciousness: sedated  Airway & Oxygen Therapy: Patient Spontanous Breathing and Patient connected to face mask oxygen  Post-op Assessment: Report given to RN and Post -op Vital signs reviewed and stable  Post vital signs: Reviewed and stable  Last Vitals:  Vitals Value Taken Time  BP    Temp    Pulse 91 01/13/21 1234  Resp 21 01/13/21 1234  SpO2 100 % 01/13/21 1234  Vitals shown include unvalidated device data.  Last Pain:  Vitals:   01/13/21 1105  TempSrc: Oral  PainSc: 0-No pain         Complications: No notable events documented.

## 2021-01-13 NOTE — Op Note (Signed)
[facility] Patient Name: Deborah Norman Procedure Date: 01/13/2021 MRN: [id number] Attending MD: Felicie Morn ,  Date of Birth: [birth year] CSN: [id number] Age: 35 Admit Type: Outpatient Procedure:                Upper GI endoscopy Indications:              Heartburn Providers:                Felicie Morn, Particia Nearing, RN, Hinton Dyer,                            Technician Referring MD:              Medicines:                Monitored Anesthesia Care Complications:            No immediate complications. Estimated Blood Loss:     Estimated blood loss was minimal. Procedure:                Pre-Anesthesia Assessment:                           - Prior to the procedure, a History and Physical                            was performed, and patient medications and                            allergies were reviewed. The patient is competent.                            The risks and benefits of the procedure and the                            sedation options and risks were discussed with the                            patient. All questions were answered and informed                            consent was obtained. Patient identification and                            proposed procedure were verified in the                            pre-procedure area. Mental Status Examination:                            alert and oriented. Prophylactic Antibiotics: The                            patient does not require prophylactic antibiotics.                            Prior Anticoagulants: The patient has taken  no                            previous anticoagulant or antiplatelet agents. ASA                            Grade Assessment: II - A patient with mild systemic                            disease. After reviewing the risks and benefits,                            the patient was deemed in satisfactory condition to                            undergo the procedure. The  anesthesia plan was to                            use monitored anesthesia care (MAC). Immediately                            prior to administration of medications, the patient                            was re-assessed for adequacy to receive sedatives.                            The heart rate, respiratory rate, oxygen                            saturations, blood pressure, adequacy of pulmonary                            ventilation, and response to care were monitored                            throughout the procedure. The physical status of                            the patient was re-assessed after the procedure.                           After obtaining informed consent, the endoscope was                            passed under direct vision. Throughout the                            procedure, the patient's blood pressure, pulse, and                            oxygen saturations were monitored continuously. The  [id number]-H190 ([id number]) Olympus endoscope was introduced                            through the mouth, and advanced to the second part                            of duodenum. The upper GI endoscopy was                            accomplished without difficulty. The patient                            tolerated the procedure well. Scope In: Scope Out: Findings:      The examined esophagus was normal.      A few localized 5 mm erosions with no bleeding and no stigmata of recent       bleeding were found in the gastric antrum. Biopsies were taken with a       cold forceps for histology. Verification of patient identification for       the specimen was done. Estimated blood loss was minimal.      The prepyloric region of the stomach was normal. Biopsies were taken       with a cold forceps for Helicobacter pylori testing.      The in the duodenum was normal. Impression:               - Normal esophagus.                           - Erosive gastropathy  with no bleeding and no                            stigmata of recent bleeding. Biopsied.                           - Normal prepyloric region of the stomach. Biopsied.                           - Normal. Moderate Sedation:      Moderate (conscious) sedation was personally administered by an       anesthesia professional. The following parameters were monitored: oxygen       saturation, heart rate, blood pressure, and response to care. Total       physician intraservice time was 15 minutes. Recommendation:           - Await pathology results.                           - Appears to have appropriate anatomy for a sleeve                            gastrectomy. Start PPI for gastritis. Await                            pathology results. Procedure Code(s):        --- Professional ---  44967, Esophagogastroduodenoscopy, flexible,                            transoral; with biopsy, single or multiple Diagnosis Code(s):        --- Professional ---                           K31.89, Other diseases of stomach and duodenum                           R12, Heartburn CPT copyright 2019 American Medical Association. All rights reserved. The codes documented in this report are preliminary and upon coder review may  be revised to meet current compliance requirements. [place],  01/13/2021 1:19:23 PM This report has been signed electronically. Number of Addenda: 0

## 2021-01-13 NOTE — Anesthesia Postprocedure Evaluation (Signed)
Anesthesia Post Note  Patient: Deborah Norman  Procedure(s) Performed: ESOPHAGOGASTRODUODENOSCOPY (EGD) BIOPSY     Patient location during evaluation: Endoscopy Anesthesia Type: MAC Level of consciousness: awake and alert Pain management: pain level controlled Vital Signs Assessment: post-procedure vital signs reviewed and stable Respiratory status: spontaneous breathing, nonlabored ventilation and respiratory function stable Cardiovascular status: blood pressure returned to baseline and stable Postop Assessment: no apparent nausea or vomiting Anesthetic complications: no   No notable events documented.  Last Vitals:  Vitals:   01/13/21 1244 01/13/21 1250  BP: (!) 155/104 (!) 146/108  Pulse: 94 75  Resp: (!) 23 14  Temp:    SpO2: 98% 99%    Last Pain:  Vitals:   01/13/21 1250  TempSrc:   PainSc: 0-No pain                 [name]

## 2021-01-13 NOTE — H&P (Signed)
Admitting Physician: Hyman Hopes Daevon Holdren  Service: Bariatric Surgery  CC: Bariatric surgery peroperative evaluation  Subjective   HPI: Taylah Dubiel is an 35 y.o. female who is here for elective EGD prior to bariatric surgery.  No past medical history on file.  Past Surgical History:  Procedure Laterality Date   BREAST LUMPECTOMY Right 2003   benign    Family History  Problem Relation Age of Onset   Heart disease Father    Hypertension Father    Depression Father    Asthma Sister     Social:  reports that she has been smoking. She has been smoking an average of .5 packs per day. She has never used smokeless tobacco. She reports current alcohol use. She reports that she does not use drugs.  Allergies: No Known Allergies  Medications: Current Outpatient Medications  Medication Instructions   acetaminophen (TYLENOL) 500 mg, Oral, Every 6 hours PRN   clindamycin (CLEOCIN T) 1 % external solution 1 application, Topical, Daily PRN   desonide (DESOWEN) 0.05 % cream 1 application, Topical, 2 times daily PRN   Vitamin D (Ergocalciferol) (DRISDOL) 50,000 Units, Oral, Every Sun    ROS - all of the below systems have been reviewed with the patient and positives are indicated with bold text General: chills, fever or night sweats Eyes: blurry vision or double vision ENT: epistaxis or sore throat Allergy/Immunology: itchy/watery eyes or nasal congestion Hematologic/Lymphatic: bleeding problems, blood clots or swollen lymph nodes Endocrine: temperature intolerance or unexpected weight changes Breast: new or changing breast lumps or nipple discharge Resp: cough, shortness of breath, or wheezing CV: chest pain or dyspnea on exertion GI: as per HPI GU: dysuria, trouble voiding, or hematuria MSK: joint pain or joint stiffness Neuro: TIA or stroke symptoms Derm: pruritus and skin lesion changes Psych: anxiety and depression  Objective   PE There were no vitals taken for this  visit. Constitutional: NAD; conversant; no deformities Eyes: Moist conjunctiva; no lid lag; anicteric; PERRL Neck: Trachea midline; no thyromegaly Lungs: Normal respiratory effort; no tactile fremitus CV: RRR; no palpable thrills; no pitting edema GI: Abd Soft, non-tender; no palpable hepatosplenomegaly MSK: Normal range of motion of extremities; no clubbing/cyanosis Psychiatric: Appropriate affect; alert and oriented x3 Lymphatic: No palpable cervical or axillary lymphadenopathy  No results found for this or any previous visit (from the past 24 hour(s)).  Imaging Orders  No imaging studies ordered today     Assessment and Plan   Allysen Bitterman is an 35 y.o. female with a BMI of 50, borderline obstructive sleep apnea and hypertension, who presented for initial bariatric surgery consultation. The patient attended the information session and is interested in pursuing bariatric surgery. I entered the patient's information into the [facility] bariatric risk-benefit calculator. I used this as a outlined to facilitate our discussion of the risks, and benefits of bariatric surgery, specifically comparing laparoscopic sleeve gastrectomy versus laparoscopic Roux-en-Y gastric bypass. The patient is interested in pursuing a sleeve gastrectomy. She completed the preoperative evaluation to include the following: - Blood work - PENDING - scheduled for tomorrow - Dietary consult - CXR 08/01/20 - No active cardiopulmonary disease. - EKG 08/01/20 - NSR - Urine nicotine test - Psychology evaluation - completed with Dr. Cyndia Skeeters - Sleep study - Borderline abnormal, symptom management  Today she presents for upper endoscopy with biopsy. I discussed my reasoning for preoperative upper endoscopy as well as the risks, benefits, and alternatives of the procedure. This way I can evaluate the four-minute anatomy,  biopsy for H. pylori, and evaluate for signs of gastroesophageal reflux. The patient granted consent to  proceed. We will proceed with endoscopy today.    Quentin Ore, MD  [facility] Use [email or web address] to contact on call provider

## 2021-01-13 NOTE — Anesthesia Preprocedure Evaluation (Signed)
Anesthesia Evaluation  Patient identified by MRN, date of birth, ID band Patient awake    Reviewed: Allergy & Precautions, NPO status , Patient's Chart, lab work & pertinent test results  History of Anesthesia Complications Negative for: history of anesthetic complications  Airway Mallampati: II  TM Distance: >3 FB Neck ROM: Full    Dental  (+) Teeth Intact   Pulmonary Current Smoker and Patient abstained from smoking.,    Pulmonary exam normal        Cardiovascular negative cardio ROS Normal cardiovascular exam     Neuro/Psych negative neurological ROS     GI/Hepatic negative GI ROS, Neg liver ROS,   Endo/Other  Morbid obesity  Renal/GU negative Renal ROS  negative genitourinary   Musculoskeletal negative musculoskeletal ROS (+)   Abdominal   Peds  Hematology negative hematology ROS (+)   Anesthesia Other Findings   Reproductive/Obstetrics                           Anesthesia Physical Anesthesia Plan  ASA: 3  Anesthesia Plan: MAC   Post-op Pain Management:    Induction: Intravenous  PONV Risk Score and Plan: 2 and Propofol infusion, TIVA and Treatment may vary due to age or medical condition  Airway Management Planned: Natural Airway, Nasal Cannula and Simple Face Mask  Additional Equipment: None  Intra-op Plan:   Post-operative Plan:   Informed Consent: I have reviewed the patients History and Physical, chart, labs and discussed the procedure including the risks, benefits and alternatives for the proposed anesthesia with the patient or authorized representative who has indicated his/her understanding and acceptance.       Plan Discussed with:   Anesthesia Plan Comments:         Anesthesia Quick Evaluation

## 2021-01-14 ENCOUNTER — Encounter (HOSPITAL_COMMUNITY): Payer: Self-pay | Admitting: Surgery

## 2021-01-14 LAB — SURGICAL PATHOLOGY

## 2021-04-27 ENCOUNTER — Encounter: Payer: BC Managed Care – PPO | Attending: Surgery | Admitting: Skilled Nursing Facility1

## 2021-04-27 ENCOUNTER — Other Ambulatory Visit: Payer: Self-pay

## 2021-04-27 DIAGNOSIS — E669 Obesity, unspecified: Secondary | ICD-10-CM | POA: Insufficient documentation

## 2021-04-27 NOTE — Progress Notes (Signed)
Pre-Operative Nutrition Class:    Patient was seen on 04/27/2021 for Pre-Operative Bariatric Surgery Education at the [facility].    Surgery date:  Surgery type: sleeve Start weight at [facility]: 254 Weight today: pt arrived too late  Samples given per MNT protocol. Patient educated on appropriate usage: Ensure max exp: July 08, 2021 Ensure max lot: [id number]  Chewable bariatric advantage: advanced multi EA exp: 08/23 Chewable bariatric advantage: advanced multi EA lot: [id number]  Bariatric advantage calcium citrate exp: 02/23 Bariatric advantage calcium citrate lot: [id number]   The following the learning objectives were met by the patient during this course: Identify Pre-Op Dietary Goals and will begin 2 weeks pre-operatively Identify appropriate sources of fluids and proteins  State protein recommendations and appropriate sources pre and post-operatively Identify Post-Operative Dietary Goals and will follow for 2 weeks post-operatively Identify appropriate multivitamin and calcium sources Describe the need for physical activity post-operatively and will follow MD recommendations State when to call healthcare provider regarding medication questions or post-operative complications When having a diagnosis of diabetes understanding hypoglycemia symptoms and the inclusion of 1 complex carbohydrate per meal  Handouts given during class include: Pre-Op Bariatric Surgery Diet Handout Protein Shake Handout Post-Op Bariatric Surgery Nutrition Handout BEL[facility] Program Information Flyer Support Group Information Flyer [facility] Bariatric Supplements Price List  Follow-Up Plan: Patient will follow-up at [facility] 2 weeks post operatively for diet advancement per MD.

## 2021-06-09 ENCOUNTER — Ambulatory Visit: Payer: Self-pay | Admitting: Surgery

## 2021-06-09 DIAGNOSIS — Z01818 Encounter for other preprocedural examination: Secondary | ICD-10-CM

## 2021-06-09 NOTE — Progress Notes (Signed)
Sent message, via epic in basket, requesting orders in epic from surgeon.  

## 2021-06-24 NOTE — Progress Notes (Signed)
COVID swab appointment:  06-26-21 @ 1:00 PM  COVID Vaccine Completed:  Yes x2 Date COVID Vaccine completed: 12-13-19 05-13-20 Has received booster: COVID vaccine manufacturer: Pfizer      Date of COVID positive in last 90 days:  PCP - Jeanice Lim, PA-C Cardiologist -   Chest x-ray - 08-01-20 Epic EKG - 08-01-20 Epic Stress Test -  ECHO -  Cardiac Cath -  Pacemaker/ICD device last checked: Spinal Cord Stimulator:  Bowel Prep -   Sleep Study - Yes,  CPAP -   Fasting Blood Sugar -  Checks Blood Sugar _____ times a day  Blood Thinner Instructions: Aspirin Instructions: Last Dose:  Activity level:  Can go up a flight of stairs and perform activities of daily living without stopping and without symptoms of chest pain or shortness of breath.   Able to exercise without symptoms  Unable to go up a flight of stairs without symptoms of      Anesthesia review:   Patient denies shortness of breath, fever, cough and chest pain at PAT appointment   Patient verbalized understanding of instructions that were given to them at the PAT appointment. Patient was also instructed that they will need to review over the PAT instructions again at home before surgery.

## 2021-06-24 NOTE — Patient Instructions (Signed)
DUE TO COVID-19 ONLY ONE VISITOR IS ALLOWED TO COME WITH YOU AND STAY IN THE WAITING ROOM ONLY DURING PRE OP AND PROCEDURE.   **NO VISITORS ARE ALLOWED IN THE SHORT STAY AREA OR RECOVERY ROOM!!**  IF YOU WILL BE ADMITTED INTO THE HOSPITAL YOU ARE ALLOWED ONLY TWO SUPPORT PEOPLE DURING VISITATION HOURS ONLY (7 AM -8PM)    Up to two visitors ages 2616+ are allowed at one time in a patient's room.  The visitors may rotate out with other people throughout the day.  Additionally, up to two children between the ages of 6112 and 5615 are allowed and do not count toward the number of allowed visitors.  Children within this age range must be accompanied by an adult visitor.  One adult visitor may remain with the patient overnight and must be in the room by 8 PM.  You are not required to quarantine, however you are required to wear a well-fitted mask when you are out and around people not in your household.  Hand Hygiene often Do NOT share personal items Notify your provider if you are in close contact with someone who has COVID or you develop fever 100.4 or greater, new onset of sneezing, cough, sore throat, shortness of breath or body aches.       Your procedure is scheduled on: Tuesday, 06-30-21   [facility]     Report to admitting at 9:00 AM   Call this number if you have problems the morning of surgery [phone]   Do not eat food :After 6:00 PM the night before surgery   May have liquids until 8:15 AM day of surgery  CLEAR LIQUID DIET  Foods Allowed                                                                     Foods Excluded  Water, Black Coffee (no milk/no creamer) and tea, regular and decaf                              liquids that you cannot  Plain Jell-O in any flavor  (No red)                         see through such as: Fruit ices (not with fruit pulp)                                 milk, soups, orange juice  Iced Popsicles (No red)                                     All solid food                             Apple juices Sports drinks like Gatorade (No red) Lightly seasoned clear broth or consume(fat free) Sugar     Complete one G2 drink the morning of surgery at 8:15 AM the day of surgery.  The day of surgery:  Drink ONE (1) Pre-Surgery G2 the morning of surgery. Drink in one sitting. Do not sip.  This drink was given to you during your hospital  pre-op appointment visit. Nothing else to drink after completing the Pre-Surgery  G2.          If you have questions, please contact your surgeons office.     Oral Hygiene is also important to reduce your risk of infection.                                    Remember - BRUSH YOUR TEETH THE MORNING OF SURGERY WITH YOUR REGULAR TOOTHPASTE   Do NOT smoke after Midnight   Take these medicines the morning of surgery with A SIP OF WATER:  None   Stop all vitamins and herbal supplements a week before surgery.     Stop Motrin, Aleve, Ibuprofen a week before surgery.             You may not have any metal on your body including hair pins, jewelry, and body piercing             Do not wear make-up, lotions, powders, perfumes or deodorant  Do not wear nail polish including gel and S&S, artificial/acrylic nails, or any other type of covering on natural nails including finger and toenails. If you have artificial nails, gel coating, etc. that needs to be removed by a nail salon please have this removed prior to surgery or surgery may need to be canceled/ delayed if the surgeon/ anesthesia feels like they are unable to be safely monitored.   Do not shave  48 hours prior to surgery.    Contacts, dentures or bridgework may not be worn into surgery.  Bring small overnight bag day of surgery.  Do not bring valuables to the hospital. [facility] IS NOT RESPONSIBLE FOR VALUABLES.   Special Instructions: Bring a copy of your healthcare power of attorney and living will documents the day  of surgery if you haven't scanned them in before.  Please read over the following fact sheets you were given: IF YOU HAVE QUESTIONS ABOUT YOUR PRE OP INSTRUCTIONS PLEASE CALL [phone]   [place] - Preparing for Surgery Before surgery, you can play an important role.  Because skin is not sterile, your skin needs to be as free of germs as possible.  You can reduce the number of germs on your skin by washing with CHG (chlorahexidine gluconate) soap before surgery.  CHG is an antiseptic cleaner which kills germs and bonds with the skin to continue killing germs even after washing. Please DO NOT use if you have an allergy to CHG or antibacterial soaps.  If your skin becomes reddened/irritated stop using the CHG and inform your nurse when you arrive at Short Stay. Do not shave (including legs and underarms) for at least 48 hours prior to the first CHG shower.  You may shave your face/neck.  Please follow these instructions carefully:  1.  Shower with CHG Soap the night before surgery and the  morning of surgery.  2.  If you choose to wash your hair, wash your hair first as usual with your normal  shampoo.  3.  After you shampoo, rinse your hair and body thoroughly to remove the shampoo.  4.  Use CHG as you would any other liquid soap.  You can apply chg directly to the skin and wash.  Gently with a scrungie or clean washcloth.  5.  Apply the CHG Soap to your body ONLY FROM THE NECK DOWN.   Do   not use on face/ open                           Wound or open sores. Avoid contact with eyes, ears mouth and   genitals (private parts).                       Wash face,  Genitals (private parts) with your normal soap.             6.  Wash thoroughly, paying special attention to the area where your    surgery  will be performed.  7.  Thoroughly rinse your body with warm water from the neck down.  8.  DO NOT shower/wash with your normal soap after using and rinsing off the CHG  Soap.                9.  Pat yourself dry with a clean towel.            10.  Wear clean pajamas.            11.  Place clean sheets on your bed the night of your first shower and do not  sleep with pets. Day of Surgery : Do not apply any lotions/deodorants the morning of surgery.  Please wear clean clothes to the hospital/surgery center.  FAILURE TO FOLLOW THESE INSTRUCTIONS MAY RESULT IN THE CANCELLATION OF YOUR SURGERY  PATIENT SIGNATURE_________________________________  NURSE SIGNATURE__________________________________  ________________________________________________________________________   Deborah Norman  An incentive spirometer is a tool that can help keep your lungs clear and active. This tool measures how well you are filling your lungs with each breath. Taking long deep breaths may help reverse or decrease the chance of developing breathing (pulmonary) problems (especially infection) following: A long period of time when you are unable to move or be active. BEFORE THE PROCEDURE  If the spirometer includes an indicator to show your best effort, your nurse or respiratory therapist will set it to a desired goal. If possible, sit up straight or lean slightly forward. Try not to slouch. Hold the incentive spirometer in an upright position. INSTRUCTIONS FOR USE  Sit on the edge of your bed if possible, or sit up as far as you can in bed or on a chair. Hold the incentive spirometer in an upright position. Breathe out normally. Place the mouthpiece in your mouth and seal your lips tightly around it. Breathe in slowly and as deeply as possible, raising the piston or the ball toward the top of the column. Hold your breath for 3-5 seconds or for as long as possible. Allow the piston or ball to fall to the bottom of the column. Remove the mouthpiece from your mouth and breathe out normally. Rest for a few seconds and repeat Steps 1 through 7 at least 10 times every 1-2 hours when  you are awake. Take your time and take a few normal breaths between deep breaths. The spirometer may include an indicator to show your best effort. Use the indicator as a goal to work toward during each repetition. After each set of 10 deep breaths, practice coughing to be sure  your lungs are clear. If you have an incision (the cut made at the time of surgery), support your incision when coughing by placing a pillow or rolled up towels firmly against it. Once you are able to get out of bed, walk around indoors and cough well. You may stop using the incentive spirometer when instructed by your caregiver.  RISKS AND COMPLICATIONS Take your time so you do not get dizzy or light-headed. If you are in pain, you may need to take or ask for pain medication before doing incentive spirometry. It is harder to take a deep breath if you are having pain. AFTER USE Rest and breathe slowly and easily. It can be helpful to keep track of a log of your progress. Your caregiver can provide you with a simple table to help with this. If you are using the spirometer at home, follow these instructions: SEEK MEDICAL CARE IF:  You are having difficultly using the spirometer. You have trouble using the spirometer as often as instructed. Your pain medication is not giving enough relief while using the spirometer. You develop fever of 100.5 F (38.1 C) or higher. SEEK IMMEDIATE MEDICAL CARE IF:  You cough up bloody sputum that had not been present before. You develop fever of 102 F (38.9 C) or greater. You develop worsening pain at or near the incision site. MAKE SURE YOU:  Understand these instructions. Will watch your condition. Will get help right away if you are not doing well or get worse. Document Released: 09/06/2006 Document Revised: 07/19/2011 Document Reviewed: 11/07/2006 Exit[employer] Patient Information 2014 Exit[employer], [place].   ________________________________________________________________________  WHAT  IS A BLOOD TRANSFUSION? Blood Transfusion Information  A transfusion is the replacement of blood or some of its parts. Blood is made up of multiple cells which provide different functions. Red blood cells carry oxygen and are used for blood loss replacement. White blood cells fight against infection. Platelets control bleeding. Plasma helps clot blood. Other blood products are available for specialized needs, such as hemophilia or other clotting disorders. BEFORE THE TRANSFUSION  Who gives blood for transfusions?  Healthy volunteers who are fully evaluated to make sure their blood is safe. This is blood bank blood. Transfusion therapy is the safest it has ever been in the practice of medicine. Before blood is taken from a donor, a complete history is taken to make sure that person has no history of diseases nor engages in risky social behavior (examples are intravenous drug use or sexual activity with multiple partners). The donor's travel history is screened to minimize risk of transmitting infections, such as malaria. The donated blood is tested for signs of infectious diseases, such as HIV and hepatitis. The blood is then tested to be sure it is compatible with you in order to minimize the chance of a transfusion reaction. If you or a relative donates blood, this is often done in anticipation of surgery and is not appropriate for emergency situations. It takes many days to process the donated blood. RISKS AND COMPLICATIONS Although transfusion therapy is very safe and saves many lives, the main dangers of transfusion include:  Getting an infectious disease. Developing a transfusion reaction. This is an allergic reaction to something in the blood you were given. Every precaution is taken to prevent this. The decision to have a blood transfusion has been considered carefully by your caregiver before blood is given. Blood is not given unless the benefits outweigh the risks. AFTER THE  TRANSFUSION Right after receiving a blood  transfusion, you will usually feel much better and more energetic. This is especially true if your red blood cells have gotten low (anemic). The transfusion raises the level of the red blood cells which carry oxygen, and this usually causes an energy increase. The nurse administering the transfusion will monitor you carefully for complications. HOME CARE INSTRUCTIONS  No special instructions are needed after a transfusion. You may find your energy is better. Speak with your caregiver about any limitations on activity for underlying diseases you may have. SEEK MEDICAL CARE IF:  Your condition is not improving after your transfusion. You develop redness or irritation at the intravenous (IV) site. SEEK IMMEDIATE MEDICAL CARE IF:  Any of the following symptoms occur over the next 12 hours: Shaking chills. You have a temperature by mouth above 102 F (38.9 C), not controlled by medicine. Chest, back, or muscle pain. People around you feel you are not acting correctly or are confused. Shortness of breath or difficulty breathing. Dizziness and fainting. You get a rash or develop hives. You have a decrease in urine output. Your urine turns a dark color or changes to pink, red, or brown. Any of the following symptoms occur over the next 10 days: You have a temperature by mouth above 102 F (38.9 C), not controlled by medicine. Shortness of breath. Weakness after normal activity. The white part of the eye turns yellow (jaundice). You have a decrease in the amount of urine or are urinating less often. Your urine turns a dark color or changes to pink, red, or brown. Document Released: 04/23/2000 Document Revised: 07/19/2011 Document Reviewed: 12/11/2007 [facility] Patient Information 2014 [place], [place].  _______________________________________________________________________

## 2021-06-26 ENCOUNTER — Encounter (HOSPITAL_COMMUNITY): Payer: Self-pay

## 2021-06-26 ENCOUNTER — Encounter (HOSPITAL_COMMUNITY)
Admission: RE | Admit: 2021-06-26 | Discharge: 2021-06-26 | Disposition: A | Discharge status: Home / Self Care | Payer: BC Managed Care – PPO | Source: Ambulatory Visit | Attending: Surgery | Admitting: Surgery

## 2021-06-26 ENCOUNTER — Other Ambulatory Visit: Payer: Self-pay

## 2021-06-26 VITALS — BP 139/97 | HR 61 | Temp 98.3°F | Resp 18 | Ht 64.5 in

## 2021-06-26 DIAGNOSIS — Z01812 Encounter for preprocedural laboratory examination: Secondary | ICD-10-CM | POA: Diagnosis not present

## 2021-06-26 DIAGNOSIS — Z01818 Encounter for other preprocedural examination: Secondary | ICD-10-CM

## 2021-06-26 DIAGNOSIS — Z20822 Contact with and (suspected) exposure to covid-19: Secondary | ICD-10-CM | POA: Insufficient documentation

## 2021-06-26 HISTORY — DX: Sleep apnea, unspecified: G47.30

## 2021-06-26 LAB — CBC WITH DIFFERENTIAL/PLATELET
Abs Immature Granulocytes: 0.02 10*3/uL (ref 0.00–0.07)
Basophils Absolute: 0 10*3/uL (ref 0.0–0.1)
Basophils Relative: 1 %
Eosinophils Absolute: 0.1 10*3/uL (ref 0.0–0.5)
Eosinophils Relative: 2 %
HCT: 39.4 % (ref 36.0–46.0)
Hemoglobin: 12.7 g/dL (ref 12.0–15.0)
Immature Granulocytes: 0 %
Lymphocytes Relative: 31 %
Lymphs Abs: 1.9 10*3/uL (ref 0.7–4.0)
MCH: 23.6 pg — ABNORMAL LOW (ref 26.0–34.0)
MCHC: 32.2 g/dL (ref 30.0–36.0)
MCV: 73.4 fL — ABNORMAL LOW (ref 80.0–100.0)
Monocytes Absolute: 0.6 10*3/uL (ref 0.1–1.0)
Monocytes Relative: 9 %
Neutro Abs: 3.5 10*3/uL (ref 1.7–7.7)
Neutrophils Relative %: 57 %
Platelets: 247 10*3/uL (ref 150–400)
RBC: 5.37 MIL/uL — ABNORMAL HIGH (ref 3.87–5.11)
RDW: 14.7 % (ref 11.5–15.5)
WBC: 6.1 10*3/uL (ref 4.0–10.5)
nRBC: 0 % (ref 0.0–0.2)

## 2021-06-26 LAB — COMPREHENSIVE METABOLIC PANEL
ALT: 19 U/L (ref 0–44)
AST: 19 U/L (ref 15–41)
Albumin: 4.1 g/dL (ref 3.5–5.0)
Alkaline Phosphatase: 64 U/L (ref 38–126)
Anion gap: 6 (ref 5–15)
BUN: 12 mg/dL (ref 6–20)
CO2: 24 mmol/L (ref 22–32)
Calcium: 9.1 mg/dL (ref 8.9–10.3)
Chloride: 104 mmol/L (ref 98–111)
Creatinine, Ser: 0.63 mg/dL (ref 0.44–1.00)
GFR, Estimated: >60 mL/min (ref >60)
Glucose, Bld: 88 mg/dL (ref 70–99)
Potassium: 3.6 mmol/L (ref 3.5–5.1)
Sodium: 134 mmol/L — ABNORMAL LOW (ref 135–145)
Total Bilirubin: 0.5 mg/dL (ref 0.3–1.2)
Total Protein: 7.7 g/dL (ref 6.5–8.1)

## 2021-06-26 LAB — SARS CORONAVIRUS 2 (TAT 6-24 HRS): SARS Coronavirus 2: NEGATIVE

## 2021-06-26 NOTE — Progress Notes (Signed)
COVID swab appointment:  06-26-21 @ 1:00 PM  COVID Vaccine Completed:  Yes x2 Date COVID Vaccine completed: 12-13-19 05-13-20 Has received booster: COVID vaccine manufacturer: Pfizer      Date of COVID positive in last 90 days:  PCP - Jeanice Lim, PA-C Cardiologist - no  Chest x-ray - 08-01-20 Epic EKG - 08-01-20 Epic Stress Test -  ECHO -  Cardiac Cath -  Pacemaker/ICD device last checked: Spinal Cord Stimulator:  Bowel Prep -     Sleep Study - Yes,  CPAP -   Fasting Blood Sugar -  Checks Blood Sugar _____ times a day  Blood Thinner Instructions: Aspirin Instructions: Last Dose:  Activity level:  Can go up a flight of stairs and perform activities of daily living without stopping and without symptoms of chest pain or shortness of breath.   Able to exercise without symptoms      Anesthesia review:   Patient denies shortness of breath, fever, cough and chest pain at PAT appointment   Patient verbalized understanding of instructions that were given to them at the PAT appointment. Patient was also instructed that they will need to review over the PAT instructions again at home before surgery.

## 2021-06-30 ENCOUNTER — Inpatient Hospital Stay (HOSPITAL_COMMUNITY)
Admission: RE | Admit: 2021-06-30 | Discharge: 2021-07-01 | DRG: 621 | Disposition: A | Discharge status: Home / Self Care | Payer: BC Managed Care – PPO | Source: Ambulatory Visit | Attending: Surgery | Admitting: Surgery

## 2021-06-30 ENCOUNTER — Encounter (HOSPITAL_COMMUNITY): Payer: Self-pay | Admitting: Surgery

## 2021-06-30 ENCOUNTER — Inpatient Hospital Stay (HOSPITAL_COMMUNITY): Payer: BC Managed Care – PPO | Admitting: Anesthesiology

## 2021-06-30 ENCOUNTER — Encounter (HOSPITAL_COMMUNITY)
Admission: RE | Disposition: A | Discharge status: Home / Self Care | Payer: Self-pay | Source: Ambulatory Visit | Attending: Surgery

## 2021-06-30 ENCOUNTER — Other Ambulatory Visit: Payer: Self-pay

## 2021-06-30 DIAGNOSIS — K2970 Gastritis, unspecified, without bleeding: Secondary | ICD-10-CM | POA: Diagnosis present

## 2021-06-30 DIAGNOSIS — Z825 Family history of asthma and other chronic lower respiratory diseases: Secondary | ICD-10-CM | POA: Diagnosis not present

## 2021-06-30 DIAGNOSIS — Z6841 Body Mass Index (BMI) 40.0 and over, adult: Secondary | ICD-10-CM

## 2021-06-30 DIAGNOSIS — Z818 Family history of other mental and behavioral disorders: Secondary | ICD-10-CM

## 2021-06-30 DIAGNOSIS — I10 Essential (primary) hypertension: Secondary | ICD-10-CM | POA: Diagnosis present

## 2021-06-30 DIAGNOSIS — Z87891 Personal history of nicotine dependence: Secondary | ICD-10-CM | POA: Diagnosis not present

## 2021-06-30 DIAGNOSIS — Z8249 Family history of ischemic heart disease and other diseases of the circulatory system: Secondary | ICD-10-CM | POA: Diagnosis not present

## 2021-06-30 DIAGNOSIS — G4733 Obstructive sleep apnea (adult) (pediatric): Secondary | ICD-10-CM | POA: Diagnosis present

## 2021-06-30 DIAGNOSIS — Z01818 Encounter for other preprocedural examination: Principal | ICD-10-CM

## 2021-06-30 HISTORY — PX: UPPER GI ENDOSCOPY: SHX6162

## 2021-06-30 LAB — TYPE AND SCREEN
ABO/RH(D): O POS
Antibody Screen: NEGATIVE

## 2021-06-30 LAB — CBC
HCT: 43.6 % (ref 36.0–46.0)
Hemoglobin: 13.6 g/dL (ref 12.0–15.0)
MCH: 22.7 pg — ABNORMAL LOW (ref 26.0–34.0)
MCHC: 31.2 g/dL (ref 30.0–36.0)
MCV: 72.7 fL — ABNORMAL LOW (ref 80.0–100.0)
Platelets: 201 10*3/uL (ref 150–400)
RBC: 6 MIL/uL — ABNORMAL HIGH (ref 3.87–5.11)
RDW: 14.8 % (ref 11.5–15.5)
WBC: 14.5 10*3/uL — ABNORMAL HIGH (ref 4.0–10.5)
nRBC: 0 % (ref 0.0–0.2)

## 2021-06-30 LAB — CREATININE, SERUM
Creatinine, Ser: 0.75 mg/dL (ref 0.44–1.00)
GFR, Estimated: >60 mL/min (ref >60)

## 2021-06-30 LAB — ABO/RH: ABO/RH(D): O POS

## 2021-06-30 LAB — PREGNANCY, URINE: Preg Test, Ur: NEGATIVE

## 2021-06-30 SURGERY — XI ROBOTIC GASTRIC SLEEVE RESECTION
Anesthesia: General | Site: Esophagus

## 2021-06-30 MED ORDER — HYDROMORPHONE HCL 2 MG/ML IJ SOLN
INTRAMUSCULAR | Status: AC
  Filled 2021-06-30: qty 1

## 2021-06-30 MED ORDER — ONDANSETRON HCL 4 MG/2ML IJ SOLN
4.0000 mg | INTRAMUSCULAR | Status: DC | PRN
  Administered 2021-06-30: 4 mg via INTRAVENOUS
  Filled 2021-06-30: qty 2

## 2021-06-30 MED ORDER — ONDANSETRON HCL 4 MG/2ML IJ SOLN
INTRAMUSCULAR | Status: DC | PRN
  Administered 2021-06-30: 4 mg via INTRAVENOUS

## 2021-06-30 MED ORDER — OXYCODONE HCL 5 MG PO TABS: 5.0000 mg | ORAL_TABLET | Freq: Four times a day (QID) | ORAL | 0 refills | Status: AC | PRN

## 2021-06-30 MED ORDER — ROCURONIUM BROMIDE 10 MG/ML (PF) SYRINGE
PREFILLED_SYRINGE | INTRAVENOUS | Status: AC
  Filled 2021-06-30: qty 20

## 2021-06-30 MED ORDER — PROPOFOL 10 MG/ML IV BOLUS
INTRAVENOUS | Status: DC | PRN
  Administered 2021-06-30: 200 mg via INTRAVENOUS

## 2021-06-30 MED ORDER — LIDOCAINE HCL (PF) 2 % IJ SOLN
INTRAMUSCULAR | Status: AC
  Filled 2021-06-30: qty 55

## 2021-06-30 MED ORDER — PROPOFOL 10 MG/ML IV BOLUS
INTRAVENOUS | Status: AC
  Filled 2021-06-30: qty 20

## 2021-06-30 MED ORDER — PANTOPRAZOLE SODIUM 40 MG PO TBEC: 40.0000 mg | DELAYED_RELEASE_TABLET | Freq: Every day | ORAL | 0 refills | Status: AC

## 2021-06-30 MED ORDER — BUPIVACAINE-EPINEPHRINE (PF) 0.25% -1:200000 IJ SOLN
INTRAMUSCULAR | Status: AC
  Filled 2021-06-30: qty 30

## 2021-06-30 MED ORDER — ACETAMINOPHEN 500 MG PO TABS
1000.0000 mg | ORAL_TABLET | Freq: Three times a day (TID) | ORAL | Status: DC
  Filled 2021-06-30: qty 2

## 2021-06-30 MED ORDER — APREPITANT 40 MG PO CAPS
40.0000 mg | ORAL_CAPSULE | ORAL | Status: AC
  Administered 2021-06-30: 40 mg via ORAL
  Filled 2021-06-30: qty 1

## 2021-06-30 MED ORDER — ENOXAPARIN SODIUM 30 MG/0.3ML IJ SOSY
30.0000 mg | PREFILLED_SYRINGE | Freq: Two times a day (BID) | INTRAMUSCULAR | Status: DC
  Administered 2021-06-30 – 2021-07-01 (×3): 30 mg via SUBCUTANEOUS
  Filled 2021-06-30 (×3): qty 0.3

## 2021-06-30 MED ORDER — PROMETHAZINE HCL 25 MG/ML IJ SOLN
INTRAMUSCULAR | Status: AC
  Administered 2021-06-30: 6.25 mg via INTRAVENOUS
  Filled 2021-06-30: qty 1

## 2021-06-30 MED ORDER — ACETAMINOPHEN 500 MG PO TABS: 1000.0000 mg | ORAL_TABLET | Freq: Three times a day (TID) | ORAL | 0 refills | Status: AC

## 2021-06-30 MED ORDER — DEXAMETHASONE SODIUM PHOSPHATE 10 MG/ML IJ SOLN
INTRAMUSCULAR | Status: AC
  Filled 2021-06-30: qty 1

## 2021-06-30 MED ORDER — BUPIVACAINE LIPOSOME 1.3 % IJ SUSP: 20.0000 mL | Freq: Once | INTRAMUSCULAR | Status: DC

## 2021-06-30 MED ORDER — LACTATED RINGERS IV SOLN: INTRAVENOUS | Status: DC

## 2021-06-30 MED ORDER — ACETAMINOPHEN 500 MG PO TABS: 1000.0000 mg | ORAL_TABLET | Freq: Once | ORAL | Status: DC

## 2021-06-30 MED ORDER — HYDROMORPHONE HCL 1 MG/ML IJ SOLN: 0.2500 mg | INTRAMUSCULAR | Status: DC | PRN

## 2021-06-30 MED ORDER — MORPHINE SULFATE (PF) 2 MG/ML IV SOLN
1.0000 mg | INTRAVENOUS | Status: DC | PRN
  Administered 2021-07-01: 2 mg via INTRAVENOUS
  Filled 2021-06-30: qty 1

## 2021-06-30 MED ORDER — CHLORHEXIDINE GLUCONATE CLOTH 2 % EX PADS: 6.0000 | MEDICATED_PAD | Freq: Once | CUTANEOUS | Status: DC

## 2021-06-30 MED ORDER — SUGAMMADEX SODIUM 200 MG/2ML IV SOLN
INTRAVENOUS | Status: DC | PRN
Start: 2021-06-30 — End: 2021-06-30
  Administered 2021-06-30: 400 mg via INTRAVENOUS

## 2021-06-30 MED ORDER — BUPIVACAINE-EPINEPHRINE 0.25% -1:200000 IJ SOLN
INTRAMUSCULAR | Status: DC | PRN
  Administered 2021-06-30: 30 mL

## 2021-06-30 MED ORDER — LIDOCAINE 2% (20 MG/ML) 5 ML SYRINGE
INTRAMUSCULAR | Status: DC | PRN
  Administered 2021-06-30: 1.5 mg/kg/h via INTRAVENOUS
  Administered 2021-06-30: 100 mg via INTRAVENOUS

## 2021-06-30 MED ORDER — AMISULPRIDE (ANTIEMETIC) 5 MG/2ML IV SOLN
INTRAVENOUS | Status: AC
  Filled 2021-06-30: qty 4

## 2021-06-30 MED ORDER — ONDANSETRON 4 MG PO TBDP: 4.0000 mg | ORAL_TABLET | Freq: Four times a day (QID) | ORAL | 0 refills | Status: AC | PRN

## 2021-06-30 MED ORDER — MIDAZOLAM HCL 2 MG/2ML IJ SOLN
INTRAMUSCULAR | Status: AC
  Filled 2021-06-30: qty 2

## 2021-06-30 MED ORDER — SUCCINYLCHOLINE CHLORIDE 200 MG/10ML IV SOSY
PREFILLED_SYRINGE | INTRAVENOUS | Status: AC
  Filled 2021-06-30: qty 10

## 2021-06-30 MED ORDER — 0.9 % SODIUM CHLORIDE (POUR BTL) OPTIME
TOPICAL | Status: DC | PRN
  Administered 2021-06-30: 1000 mL

## 2021-06-30 MED ORDER — SCOPOLAMINE 1 MG/3DAYS TD PT72
1.0000 | MEDICATED_PATCH | TRANSDERMAL | Status: DC
  Administered 2021-06-30: 1.5 mg via TRANSDERMAL
  Filled 2021-06-30: qty 1

## 2021-06-30 MED ORDER — ENOXAPARIN SODIUM 40 MG/0.4ML IJ SOSY
40.0000 mg | PREFILLED_SYRINGE | INTRAMUSCULAR | Status: AC
  Administered 2021-06-30: 40 mg via SUBCUTANEOUS
  Filled 2021-06-30: qty 0.4

## 2021-06-30 MED ORDER — ONDANSETRON HCL 4 MG/2ML IJ SOLN
INTRAMUSCULAR | Status: AC
  Filled 2021-06-30: qty 4

## 2021-06-30 MED ORDER — ROCURONIUM BROMIDE 10 MG/ML (PF) SYRINGE
PREFILLED_SYRINGE | INTRAVENOUS | Status: DC | PRN
  Administered 2021-06-30: 100 mg via INTRAVENOUS

## 2021-06-30 MED ORDER — KETAMINE HCL 10 MG/ML IJ SOLN
INTRAMUSCULAR | Status: DC | PRN
  Administered 2021-06-30: 30 mg via INTRAVENOUS

## 2021-06-30 MED ORDER — PROMETHAZINE HCL 25 MG/ML IJ SOLN: 6.2500 mg | INTRAMUSCULAR | Status: DC | PRN

## 2021-06-30 MED ORDER — SODIUM CHLORIDE 0.9 % IV SOLN
2.0000 g | INTRAVENOUS | Status: AC
  Administered 2021-06-30: 2 g via INTRAVENOUS
  Filled 2021-06-30: qty 2

## 2021-06-30 MED ORDER — ONDANSETRON HCL 4 MG/2ML IJ SOLN
INTRAMUSCULAR | Status: AC
  Filled 2021-06-30: qty 2

## 2021-06-30 MED ORDER — SIMETHICONE 80 MG PO CHEW: 80.0000 mg | CHEWABLE_TABLET | Freq: Four times a day (QID) | ORAL | Status: DC | PRN

## 2021-06-30 MED ORDER — MEPERIDINE HCL 50 MG/ML IJ SOLN: 6.2500 mg | INTRAMUSCULAR | Status: DC | PRN

## 2021-06-30 MED ORDER — BUPIVACAINE LIPOSOME 1.3 % IJ SUSP
INTRAMUSCULAR | Status: AC
  Filled 2021-06-30: qty 20

## 2021-06-30 MED ORDER — MIDAZOLAM HCL 5 MG/5ML IJ SOLN
INTRAMUSCULAR | Status: DC | PRN
  Administered 2021-06-30: 2 mg via INTRAVENOUS

## 2021-06-30 MED ORDER — OXYCODONE HCL 5 MG/5ML PO SOLN
5.0000 mg | Freq: Four times a day (QID) | ORAL | Status: DC | PRN
  Administered 2021-07-01 (×2): 5 mg via ORAL
  Filled 2021-06-30 (×2): qty 5

## 2021-06-30 MED ORDER — PANTOPRAZOLE SODIUM 40 MG IV SOLR
40.0000 mg | Freq: Every day | INTRAVENOUS | Status: DC
  Administered 2021-06-30: 40 mg via INTRAVENOUS
  Filled 2021-06-30: qty 10

## 2021-06-30 MED ORDER — FENTANYL CITRATE (PF) 250 MCG/5ML IJ SOLN
INTRAMUSCULAR | Status: AC
  Filled 2021-06-30: qty 5

## 2021-06-30 MED ORDER — OXYCODONE HCL 5 MG PO TABS: 5.0000 mg | ORAL_TABLET | Freq: Once | ORAL | Status: DC | PRN

## 2021-06-30 MED ORDER — ROCURONIUM BROMIDE 10 MG/ML (PF) SYRINGE
PREFILLED_SYRINGE | INTRAVENOUS | Status: AC
  Filled 2021-06-30: qty 10

## 2021-06-30 MED ORDER — FENTANYL CITRATE (PF) 250 MCG/5ML IJ SOLN
INTRAMUSCULAR | Status: DC | PRN
Start: 2021-06-30 — End: 2021-06-30
  Administered 2021-06-30 (×2): 50 ug via INTRAVENOUS

## 2021-06-30 MED ORDER — HYDROMORPHONE HCL 1 MG/ML IJ SOLN
INTRAMUSCULAR | Status: AC
  Filled 2021-06-30: qty 2

## 2021-06-30 MED ORDER — OXYCODONE HCL 5 MG/5ML PO SOLN: 5.0000 mg | Freq: Once | ORAL | Status: DC | PRN

## 2021-06-30 MED ORDER — CHLORHEXIDINE GLUCONATE 0.12 % MT SOLN
15.0000 mL | Freq: Once | OROMUCOSAL | Status: AC
  Administered 2021-06-30: 15 mL via OROMUCOSAL

## 2021-06-30 MED ORDER — ACETAMINOPHEN 160 MG/5ML PO SOLN
1000.0000 mg | Freq: Three times a day (TID) | ORAL | Status: DC
  Administered 2021-06-30: 1000 mg via ORAL
  Filled 2021-06-30: qty 40.6

## 2021-06-30 MED ORDER — EPHEDRINE SULFATE-NACL 50-0.9 MG/10ML-% IV SOSY
PREFILLED_SYRINGE | INTRAVENOUS | Status: DC | PRN
Start: 2021-06-30 — End: 2021-06-30
  Administered 2021-06-30 (×3): 5 mg via INTRAVENOUS

## 2021-06-30 MED ORDER — BUPIVACAINE LIPOSOME 1.3 % IJ SUSP
INTRAMUSCULAR | Status: DC | PRN
  Administered 2021-06-30: 20 mL

## 2021-06-30 MED ORDER — AMISULPRIDE (ANTIEMETIC) 5 MG/2ML IV SOLN
10.0000 mg | Freq: Once | INTRAVENOUS | Status: AC | PRN
  Administered 2021-06-30: 10 mg via INTRAVENOUS

## 2021-06-30 MED ORDER — DEXAMETHASONE SODIUM PHOSPHATE 10 MG/ML IJ SOLN
INTRAMUSCULAR | Status: DC | PRN
  Administered 2021-06-30: 10 mg via INTRAVENOUS

## 2021-06-30 MED ORDER — KETAMINE HCL 50 MG/5ML IJ SOSY
PREFILLED_SYRINGE | INTRAMUSCULAR | Status: AC
  Filled 2021-06-30: qty 5

## 2021-06-30 MED ORDER — ENSURE MAX PROTEIN PO LIQD: 2.0000 [oz_av] | ORAL | Status: DC

## 2021-06-30 MED ORDER — DEXAMETHASONE SODIUM PHOSPHATE 10 MG/ML IJ SOLN
INTRAMUSCULAR | Status: AC
  Filled 2021-06-30: qty 2

## 2021-06-30 MED ORDER — ORAL CARE MOUTH RINSE: 15.0000 mL | Freq: Once | OROMUCOSAL | Status: AC

## 2021-06-30 MED ORDER — HYDROMORPHONE HCL 1 MG/ML IJ SOLN
INTRAMUSCULAR | Status: DC | PRN
Start: 2021-06-30 — End: 2021-06-30
  Administered 2021-06-30: .4 mg via INTRAVENOUS

## 2021-06-30 MED ORDER — ACETAMINOPHEN 500 MG PO TABS
1000.0000 mg | ORAL_TABLET | ORAL | Status: AC
  Administered 2021-06-30: 1000 mg via ORAL
  Filled 2021-06-30: qty 2

## 2021-06-30 SURGICAL SUPPLY — 72 items
APPLIER CLIP 5 13 M/L LIGAMAX5 (MISCELLANEOUS)
APPLIER CLIP ROT 10 11.4 M/L (STAPLE)
BAG COUNTER SPONGE SURGICOUNT (BAG) ×2 IMPLANT
BLADE SURG SZ11 CARB STEEL (BLADE) ×3 IMPLANT
CANNULA REDUC XI 12-8 STAPL (CANNULA) ×1
CANNULA REDUCER 12-8 DVNC XI (CANNULA) ×2 IMPLANT
CHLORAPREP W/TINT 26 (MISCELLANEOUS) ×5 IMPLANT
CLIP APPLIE 5 13 M/L LIGAMAX5 (MISCELLANEOUS) IMPLANT
CLIP APPLIE ROT 10 11.4 M/L (STAPLE) IMPLANT
COVER SURGICAL LIGHT HANDLE (MISCELLANEOUS) ×3 IMPLANT
COVER TIP SHEARS 8 DVNC (MISCELLANEOUS) IMPLANT
COVER TIP SHEARS 8MM DA VINCI (MISCELLANEOUS)
DERMABOND ADVANCED (GAUZE/BANDAGES/DRESSINGS) ×1
DERMABOND ADVANCED .7 DNX12 (GAUZE/BANDAGES/DRESSINGS) ×2 IMPLANT
DRAPE ARM DVNC X/XI (DISPOSABLE) ×8 IMPLANT
DRAPE COLUMN DVNC XI (DISPOSABLE) ×2 IMPLANT
DRAPE DA VINCI XI ARM (DISPOSABLE) ×4
DRAPE DA VINCI XI COLUMN (DISPOSABLE) ×1
ELECT REM PT RETURN 15FT ADLT (MISCELLANEOUS) ×3 IMPLANT
GAUZE 4X4 16PLY ~~LOC~~+RFID DBL (SPONGE) ×3 IMPLANT
GLOVE SURG ENC MOIS LTX SZ7.5 (GLOVE) ×6 IMPLANT
GLOVE SURG UNDER LTX SZ8 (GLOVE) ×6 IMPLANT
GOWN STRL REUS W/TWL XL LVL3 (GOWN DISPOSABLE) ×7 IMPLANT
GRASPER SUT TROCAR 14GX15 (MISCELLANEOUS) ×3 IMPLANT
HEMOSTAT SNOW SURGICEL 2X4 (HEMOSTASIS) IMPLANT
IRRIG SUCT STRYKERFLOW 2 WTIP (MISCELLANEOUS) ×3
IRRIGATION SUCT STRKRFLW 2 WTP (MISCELLANEOUS) ×2 IMPLANT
KIT BASIN OR (CUSTOM PROCEDURE TRAY) ×3 IMPLANT
KIT TURNOVER KIT A (KITS) ×1 IMPLANT
LUBRICANT JELLY K Y 4OZ (MISCELLANEOUS) IMPLANT
MARKER SKIN DUAL TIP RULER LAB (MISCELLANEOUS) IMPLANT
MAT PREVALON FULL STRYKER (MISCELLANEOUS) ×3 IMPLANT
NDL SPNL 18GX3.5 QUINCKE PK (NEEDLE) ×2 IMPLANT
NEEDLE SPNL 18GX3.5 QUINCKE PK (NEEDLE) ×3 IMPLANT
OBTURATOR OPTICAL STANDARD 8MM (TROCAR) ×1
OBTURATOR OPTICAL STND 8 DVNC (TROCAR) ×2
OBTURATOR OPTICALSTD 8 DVNC (TROCAR) ×2 IMPLANT
PACK CARDIOVASCULAR III (CUSTOM PROCEDURE TRAY) ×3 IMPLANT
RELOAD STAPLE 60 2.5 WHT DVNC (STAPLE) IMPLANT
RELOAD STAPLE 60 3.5 BLU DVNC (STAPLE) IMPLANT
RELOAD STAPLER 2.5X60 WHT DVNC (STAPLE) ×6 IMPLANT
RELOAD STAPLER 3.5X60 BLU DVNC (STAPLE) ×6 IMPLANT
SCISSORS LAP 5X35 DISP (ENDOMECHANICALS) IMPLANT
SEAL CANN UNIV 5-8 DVNC XI (MISCELLANEOUS) ×6 IMPLANT
SEAL XI 5MM-8MM UNIVERSAL (MISCELLANEOUS) ×3
SEALER SYNCHRO 8 IS4000 DV (MISCELLANEOUS) ×1
SEALER SYNCHRO 8 IS4000 DVNC (MISCELLANEOUS) ×2 IMPLANT
SLEEVE GASTRECTOMY 40FR VISIGI (MISCELLANEOUS) ×1 IMPLANT
SOL ANTI FOG 6CC (MISCELLANEOUS) ×2 IMPLANT
SOLUTION ANTI FOG 6CC (MISCELLANEOUS) ×1
SOLUTION ELECTROLUBE (MISCELLANEOUS) ×3 IMPLANT
SPIKE FLUID TRANSFER (MISCELLANEOUS) ×2 IMPLANT
STAPLER 60 DA VINCI SURE FORM (STAPLE) ×1
STAPLER 60 SUREFORM DVNC (STAPLE) ×2 IMPLANT
STAPLER CANNULA SEAL DVNC XI (STAPLE) ×2 IMPLANT
STAPLER CANNULA SEAL XI (STAPLE) ×1
STAPLER RELOAD 2.5X60 WHITE (STAPLE) ×3
STAPLER RELOAD 2.5X60 WHT DVNC (STAPLE) ×6
STAPLER RELOAD 3.5X60 BLU DVNC (STAPLE) ×6
STAPLER RELOAD 3.5X60 BLUE (STAPLE) ×3
SUT MNCRL AB 4-0 PS2 18 (SUTURE) ×6 IMPLANT
SUT VIC AB 0 CT1 27 (SUTURE) ×1
SUT VIC AB 0 CT1 27XBRD ANTBC (SUTURE) ×2 IMPLANT
SUT VIC AB 2-0 SH 27 (SUTURE)
SUT VIC AB 2-0 SH 27XBRD (SUTURE) IMPLANT
SYR 20ML LL LF (SYRINGE) ×3 IMPLANT
TOWEL OR 17X26 10 PK STRL BLUE (TOWEL DISPOSABLE) ×3 IMPLANT
TRAY FOLEY MTR SLVR 16FR STAT (SET/KITS/TRAYS/PACK) IMPLANT
TROCAR ADV FIXATION 12X100MM (TROCAR) IMPLANT
TROCAR Z-THREAD FIOS 5X100MM (TROCAR) ×3 IMPLANT
TUBE CALIBRATION LAPBAND (TUBING) IMPLANT
TUBING INSUFFLATION 10FT LAP (TUBING) ×3 IMPLANT

## 2021-06-30 NOTE — Discharge Summary (Signed)
°  Patient ID: Bryn Saline [id number] 35 y.o. 06-Dec-1985  06/30/2021  Discharge date and time: 07/01/2021  Admitting Physician: Hyman Hopes Calliope Delangel  Discharge Physician: Hyman Hopes Adithi Gammon  Admission Diagnoses: Morbid obesity (HCC) [E66.01] Patient Active Problem List   Diagnosis Date Noted   Morbid obesity (HCC) 06/30/2021     Discharge Diagnoses: Morbid obesity Patient Active Problem List   Diagnosis Date Noted   Morbid obesity (HCC) 06/30/2021    Operations: Procedure(s): XI ROBOTIC GASTRIC SLEEVE RESECTION UPPER GI ENDOSCOPY  Admission Condition: good  Discharged Condition: good  Indication for Admission: Morbid obesity, BMI 42.99  Hospital Course: Ms. Hietpas presented for elective robotic sleeve gastrectomy with upper endoscopy on 06/30/21.  She recovered in the hospital and was discharged.  Consults: None  Significant Diagnostic Studies: none  Treatments: surgery: as above  Disposition: Home  Patient Instructions:  Allergies as of 07/01/2021   No Known Allergies      Medication List     TAKE these medications    acetaminophen 500 MG tablet Commonly known as: TYLENOL Take 2 tablets (1,000 mg total) by mouth every 8 (eight) hours for 5 days.   ondansetron 4 MG disintegrating tablet Commonly known as: ZOFRAN-ODT Take 1 tablet (4 mg total) by mouth every 6 (six) hours as needed for nausea or vomiting.   oxyCODONE 5 MG immediate release tablet Commonly known as: Oxy IR/ROXICODONE Take 1 tablet (5 mg total) by mouth every 6 (six) hours as needed for severe pain.   pantoprazole 40 MG tablet Commonly known as: Protonix Take 1 tablet (40 mg total) by mouth daily. What changed: Another medication with the same name was added. Make sure you understand how and when to take each.   pantoprazole 40 MG tablet Commonly known as: PROTONIX Take 1 tablet (40 mg total) by mouth daily. What changed: You were already taking a medication with the same name, and  this prescription was added. Make sure you understand how and when to take each.   Vitamin D (Ergocalciferol) 1.25 MG (50000 UNIT) Caps capsule Commonly known as: DRISDOL Take 50,000 Units by mouth every Sunday.        Activity: no heavy lifting for 4 weeks Diet:  Bariatric diet protocol Wound Care: keep wound clean and dry  Follow-up:  With Dr. Dossie Der per follow up schedule.  Signed: Hyman Hopes Toshiyuki Fredell [facility]imally In[facility], [place]   07/01/2021, 7:26 AM

## 2021-06-30 NOTE — Anesthesia Postprocedure Evaluation (Signed)
Anesthesia Post Note  Patient: Deborah Norman  Procedure(s) Performed: XI ROBOTIC GASTRIC SLEEVE RESECTION (Abdomen) UPPER GI ENDOSCOPY (Esophagus)     Patient location during evaluation: PACU Anesthesia Type: General Level of consciousness: awake and alert, oriented and patient cooperative Pain management: pain level controlled Vital Signs Assessment: post-procedure vital signs reviewed and stable Respiratory status: spontaneous breathing, nonlabored ventilation and respiratory function stable Cardiovascular status: blood pressure returned to baseline and stable Postop Assessment: no apparent nausea or vomiting Anesthetic complications: no   No notable events documented.  Last Vitals:  Vitals:   06/30/21 0919 06/30/21 1330  BP: (!) 139/94 (!) 149/97  Pulse: 67 77  Resp: 18 16  Temp: 36.9 C 36.9 C  SpO2: 98% 100%    Last Pain:  Vitals:   06/30/21 1415  TempSrc:   PainSc: 0-No pain                 Lannie Fields

## 2021-06-30 NOTE — Progress Notes (Signed)

## 2021-06-30 NOTE — Op Note (Signed)
Patient: Deborah Norman (January 14, 1986, [id number])  Date of Surgery: 06/30/2021   Preoperative Diagnosis: MORBID OBESITY, BMI 42.99  Postoperative Diagnosis: Morbid obesity, BMI 42.99  Surgical Procedure: XI ROBOTIC GASTRIC SLEEVE RESECTION: 43775 (CPT) UPPER GI ENDOSCOPY: [id number]   Operative Team Members:  Surgeon(s) and Role:    * Eline Geng, Hyman Hopes, MD - Primary    * Luretha Murphy, MD - Assisting   Anesthesiologist: Lannie Fields, DO CRNA: Ponciano Ort, CRNA; Minerva Ends, CRNA   Anesthesia: General   Fluids:  No intake/output data recorded.  Complications: None  Drains:  none   Specimen: Partial gastrectomy  Disposition:  PACU - hemodynamically stable.  Plan of Care: Admit to inpatient     Indications for Procedure: Shevawn Ziska is an 36 y.o. female with a BMI of Body mass index is 42.99 kg/m, borderline obstructive sleep apnea and hypertension, who presented for initial bariatric surgery consultation. The patient attended the information session and is interested in pursuing bariatric surgery. I entered the patient's information into the [facility] bariatric risk-benefit calculator. I used this as a outline to facilitate our discussion of the risks, and benefits of bariatric surgery, specifically comparing laparoscopic sleeve gastrectomy versus laparoscopic Roux-en-Y gastric bypass. The patient is interested in pursuing a sleeve gastrectomy. She completed the preoperative evaluation to include the following: - Blood work - PENDING - scheduled for tomorrow - Dietary consult - CXR 08/01/20 - No active cardiopulmonary disease. - EKG 08/01/20 - NSR - Urine nicotine test - Psychology evaluation - completed with Dr. Cyndia Skeeters - Sleep study - Borderline abnormal, symptom management  - Upper endoscopy - normal anatomy, gastritis, negative biopsy for H. Pylori   Today she presents for robotic sleeve gastrectomy with upper endoscopy.  We again discussed the risks,  benefits and alternatives and the patient granted consent to proceed.  We will proceed as scheduled.     Findings: Normal anatomy  Infection status: Patient: Private Patient Elective Case Case: Elective Infection Present At Time Of Surgery (PATOS): None   Description of Procedure:   On the date stated above, the patient was taken to the operating room suite and placed in supine positioning.  General endotracheal anesthesia was induced.  A timeout was completed verifying the correct patient, procedure, positioning and equipment needed for the case.  The patient's abdomen was prepped and draped in the usual sterile fashion.  I entered the patient's right upper quadrant using a 5 mm trocar in the optical technique.  There was no trauma to underlying viscera with initial trocar placement.  The abdomen was insufflated 15 mmHg.  A total of 4 robotic trochars were placed across the mid abdomen, including the 5 mm initial trocar being upsized to a 8 mm trocar.  The robotic stapler trocar was placed in the number two position.  The [facility] liver retractor was placed through the subxiphoid region and under the left lobe of the liver and was connected to the rail of the bed.  A TAP block was placed using marcaine and Exparel under direct vision of the laparoscope.  The [employer] XI robotic platform was docked and we transitioned to robotic surgery.   Using the tip up fenestrated grasper, fenestrated bipolar, 30 degree camera and Synchroseal from the patient's right to left, we began by dissecting the angle of His off the left crus of the diaphragm.  The adhesions between the stomach, spleen and diaphragm were divided using the Synchroseal to define the angle of His.  I then  started 6 cm away from the pylorus along the greater curve the stomach and divided the gastroepiploic vessels and the gastrocolic ligament.  The lesser sac was entered.  There were really no adhesions to the posterior wall of the stomach.   The greater curve was mobilized working superiorly toward the spleen.  All of the gastroepiploic and short gastric vessels were divided as we divided the gastrocolic and gastrosplenic ligaments.  As we reached the splenic hilum, I lifted the stomach anteriorly.  I created a tunnel between the stomach and its attachments to the retroperitoneum posteriorly just to the left of the GE junction until I encountered the left crus and my previous angle of His dissection.  We then were able to approach the shortest of the short gastrics both from the greater curve the stomach laterally and from the left crus medially.  These were divided using the Synchroseal and the fundus of the stomach was fully mobilized.  With the stomach fully mobilized we direct our attention to stapling.  A 40 French VISI G was inserted into the stomach and positioned along the lesser curve the stomach and suction was applied.  The 60 mm robotic sureform linear stapler was used to create the sleeve gastrectomy.  We started 6 cm from the pylorus and were careful to avoid narrowing at the incisura.  We stayed about 1 cm away from the GE junction to protect the sling fibers.  We used blue and white loads of the Sureform linear stapler.  With the sleeve gastrectomy completed the VISI G was taken off suction and removed and we performed an upper endoscopy.  The foregut was submerged in saline irrigation and the adult upper endoscope was inserted into the stomach as far as the pylorus to inspect the sleeve.  The sleeve appeared appropriately oriented without any twisting.  There was good hemostasis.  The sleeve was widely patent at the incisura with no narrowing.  There was no significant retained fundus.  The sleeve was inflated with the endoscope and there was no bubbling of the irrigation, suggesting a negative leak test and a airtight sleeve gastrectomy.  The foregut was decompressed with the endoscope and the endoscope was removed.  A 2-0 vicryl  running suture was placed to pexy the distal 12cm of the staple line back to the omentum.  There was good hemostasis at the end of the case.  The robot was undocked and moved away from the field.  The sleeve gastrectomy specimen was removed from the stapler port.  The fascia of the stapler port was closed using a 0 Vicryl on a PMI suture passer.   The liver retractor was removed under direct vision.  The pneumoperitoneum was evacuated.  The skin was closed using 4-0 Monocryl and Dermabond.  All sponge and needle counts were correct at the end of the case.    Ivar Drape, MD General, Bariatric, & Minimally Invasive Surgery [facility], [place]

## 2021-06-30 NOTE — Progress Notes (Signed)
Pt to sedated/sleepy to perform Incentive Spirometer exercises at this time.

## 2021-06-30 NOTE — Progress Notes (Signed)
PHARMACY CONSULT FOR:  Risk Assessment for Post-Discharge VTE Following Bariatric Surgery  Post-Discharge VTE Risk Assessment: This patient's probability of 30-day post-discharge VTE is increased due to the factors marked: X Sleeve gastrectomy   Liver disorder (transplant, cirrhosis, or nonalcoholic steatohepatitis)   Hx of VTE   Hemorrhage requiring transfusion   GI perforation, leak, or obstruction   ====================================================    Female    Age >/=60 years    BMI >/=50 kg/m2    CHF    Dyspnea at Rest    Paraplegia  X  Non-gastric-band surgery    Operation Time >/=3 hr    Return to OR     Length of Stay >/= 3 d   Hypercoagulable condition   Significant venous stasis    Predicted probability of 30-day post-discharge VTE: 0.16% (mild)  Other patient-specific factors to consider: N/A  Recommendation for Discharge: No pharmacologic prophylaxis post-discharge  Deborah Norman is a 36 y.o. female who underwent gastric sleeve resection on 06/30/21   Case start: 1204 Case end: 1320   No Known Allergies  Patient Measurements: Height: 5' 4.5" (163.8 cm) Weight: 115.4 kg (254 lb 6.4 oz) IBW/kg (Calculated) : 55.85 Body mass index is 42.99 kg/m.  No results for input(s): WBC, HGB, HCT, PLT, APTT, CREATININE, LABCREA, CREATININE, CREAT24HRUR, MG, PHOS, ALBUMIN, PROT, ALBUMIN, AST, ALT, ALKPHOS, BILITOT, BILIDIR, IBILI in the last 72 hours. Estimated Creatinine Clearance: 125.8 mL/min (by C-G formula based on SCr of 0.63 mg/dL).    Past Medical History:  Diagnosis Date   Sleep apnea    no CPAP  mild     Medications Prior to Admission  Medication Sig Dispense Refill Last Dose   Vitamin D, Ergocalciferol, (DRISDOL) 1.25 MG (50000 UNIT) CAPS capsule Take 50,000 Units by mouth every Sunday.   Past Week   pantoprazole (PROTONIX) 40 MG tablet Take 1 tablet (40 mg total) by mouth daily. (Patient not taking: Reported on 06/23/2021) 90 tablet 3 Not Taking     Rexford Maus, PharmD 06/30/2021 1:35 PM

## 2021-06-30 NOTE — Progress Notes (Addendum)
Started water. Pt still very sleepy.

## 2021-06-30 NOTE — Transfer of Care (Signed)
Immediate Anesthesia Transfer of Care Note  Patient: Deborah Norman  Procedure(s) Performed: XI ROBOTIC GASTRIC SLEEVE RESECTION (Abdomen) UPPER GI ENDOSCOPY (Esophagus)  Patient Location: PACU  Anesthesia Type:General  Level of Consciousness: drowsy and patient cooperative  Airway & Oxygen Therapy: Patient Spontanous Breathing and Patient connected to face mask oxygen  Post-op Assessment: Report given to RN and Post -op Vital signs reviewed and stable  Post vital signs: Reviewed and stable  Last Vitals:  Vitals Value Taken Time  BP 149/97 06/30/21 1330  Temp    Pulse 75 06/30/21 1334  Resp 22 06/30/21 1334  SpO2 100 % 06/30/21 1334  Vitals shown include unvalidated device data.  Last Pain:  Vitals:   06/30/21 0946  TempSrc:   PainSc: 0-No pain      Patients Stated Pain Goal: 4 (06/30/21 0946)  Complications: No notable events documented.

## 2021-06-30 NOTE — Anesthesia Preprocedure Evaluation (Addendum)
Anesthesia Evaluation  Patient identified by MRN, date of birth, ID band Patient awake    Reviewed: Allergy & Precautions, NPO status , Patient's Chart, lab work & pertinent test results  Airway Mallampati: II  TM Distance: >3 FB Neck ROM: Full    Dental no notable dental hx. (+) Dental Advisory Given, Teeth Intact   Pulmonary sleep apnea (no CPAP) , Patient abstained from smoking., former smoker,    Pulmonary exam normal breath sounds clear to auscultation       Cardiovascular negative cardio ROS Normal cardiovascular exam Rhythm:Regular Rate:Normal     Neuro/Psych negative neurological ROS  negative psych ROS   GI/Hepatic Neg liver ROS, GERD  Medicated and Controlled,  Endo/Other  Morbid obesity  Renal/GU negative Renal ROS  negative genitourinary   Musculoskeletal negative musculoskeletal ROS (+)   Abdominal (+) + obese,   Peds  Hematology negative hematology ROS (+) hct 39.4, plt 247   Anesthesia Other Findings   Reproductive/Obstetrics negative OB ROS                            Anesthesia Physical Anesthesia Plan  ASA: 3  Anesthesia Plan: General   Post-op Pain Management: Tylenol PO (pre-op)*, Dilaudid IV, Ketamine IV* and Lidocaine infusion*   Induction: Intravenous  PONV Risk Score and Plan: 4 or greater and Ondansetron, Dexamethasone, Midazolam, Scopolamine patch - Pre-op and Treatment may vary due to age or medical condition  Airway Management Planned: Oral ETT  Additional Equipment: None  Intra-op Plan:   Post-operative Plan: Extubation in OR  Informed Consent: I have reviewed the patients History and Physical, chart, labs and discussed the procedure including the risks, benefits and alternatives for the proposed anesthesia with the patient or authorized representative who has indicated his/her understanding and acceptance.     Dental advisory given  Plan  Discussed with: CRNA  Anesthesia Plan Comments:        Anesthesia Quick Evaluation

## 2021-06-30 NOTE — H&P (Signed)
Admitting Physician: [place]  Service: Bariatric surgery  CC: Morbid obesity  Subjective   HPI: Deborah Norman is an 36 y.o. female who is here for robotic sleeve gastrectomy  Past Medical History:  Diagnosis Date   Sleep apnea    no CPAP  mild    Past Surgical History:  Procedure Laterality Date   BIOPSY  01/13/2021   Procedure: BIOPSY;  Surgeon: Felicie Morn, MD;  Location: [facility];  Service: General;;   BREAST LUMPECTOMY Right 2003   benign   ESOPHAGOGASTRODUODENOSCOPY N/A 01/13/2021   Procedure: ESOPHAGOGASTRODUODENOSCOPY (EGD);  Surgeon: Felicie Morn, MD;  Location: [facility];  Service: General;  Laterality: N/A;    Family History  Problem Relation Age of Onset   Heart disease Father    Hypertension Father    Depression Father    Asthma Sister     Social:  reports that she quit smoking about 14 months ago. Her smoking use included cigarettes. She smoked an average of .5 packs per day. She has never used smokeless tobacco. She reports that she does not currently use alcohol. She reports that she does not use drugs.  Allergies: No Known Allergies  Medications: Current Outpatient Medications  Medication Instructions   pantoprazole (PROTONIX) 40 mg, Oral, Daily   Vitamin D (Ergocalciferol) (DRISDOL) 50,000 Units, Oral, Every Sun    ROS - all of the below systems have been reviewed with the patient and positives are indicated with bold text General: chills, fever or night sweats Eyes: blurry vision or double vision ENT: epistaxis or sore throat Allergy/Immunology: itchy/watery eyes or nasal congestion Hematologic/Lymphatic: bleeding problems, blood clots or swollen lymph nodes Endocrine: temperature intolerance or unexpected weight changes Breast: new or changing breast lumps or nipple discharge Resp: cough, shortness of breath, or wheezing CV: chest pain or dyspnea on exertion GI: as per HPI GU: dysuria, trouble voiding, or  hematuria MSK: joint pain or joint stiffness Neuro: TIA or stroke symptoms Derm: pruritus and skin lesion changes Psych: anxiety and depression  Objective   PE Blood pressure (!) 139/94, pulse 67, temperature 98.5 F (36.9 C), temperature source Oral, resp. rate 18, height 5' 4.5" (1.638 m), weight 115.4 kg, SpO2 98 %. Constitutional: NAD; conversant; no deformities Eyes: Moist conjunctiva; no lid lag; anicteric; PERRL Neck: Trachea midline; no thyromegaly Lungs: Normal respiratory effort; no tactile fremitus CV: RRR; no palpable thrills; no pitting edema GI: Abd Soft, nontender; no palpable hepatosplenomegaly MSK: Normal range of motion of extremities; no clubbing/cyanosis Psychiatric: Appropriate affect; alert and oriented x3 Lymphatic: No palpable cervical or axillary lymphadenopathy  Results for orders placed or performed during the hospital encounter of 06/30/21 (from the past 24 hour(s))  Pregnancy, urine     Status: None   Collection Time: 06/30/21  9:09 AM  Result Value Ref Range   Preg Test, Ur NEGATIVE NEGATIVE    Imaging Orders  No imaging studies ordered today     Assessment and Plan   Deborah Norman is an 36 y.o. female with a BMI of Body mass index is 42.99 kg/m. , borderline obstructive sleep apnea and hypertension, who presented for initial bariatric surgery consultation. The patient attended the information session and is interested in pursuing bariatric surgery. I entered the patient's information into the [facility] bariatric risk-benefit calculator. I used this as a outline to facilitate our discussion of the risks, and benefits of bariatric surgery, specifically comparing laparoscopic sleeve gastrectomy versus laparoscopic Roux-en-Y gastric bypass. The patient is interested  in pursuing a sleeve gastrectomy. She completed the preoperative evaluation to include the following: - Blood work - PENDING - scheduled for tomorrow - Dietary consult - CXR 08/01/20 - No  active cardiopulmonary disease. - EKG 08/01/20 - NSR - Urine nicotine test - Psychology evaluation - completed with Dr. Ardath Sax - Sleep study - Borderline abnormal, symptom management  - Upper endoscopy - normal anatomy, gastritis, negative biopsy for H. Pylori  Today she presents for robotic sleeve gastrectomy with upper endoscopy.  We again discussed the risks, benefits and alternatives and the patient granted consent to proceed.  We will proceed as scheduled.       Felicie Morn, MD  [facility] Use [email or web address] to contact on call provider

## 2021-06-30 NOTE — Anesthesia Procedure Notes (Signed)
Procedure Name: Intubation Date/Time: 06/30/2021 11:50 AM Performed by: Cleda Daub, CRNA Pre-anesthesia Checklist: Patient identified, Emergency Drugs available, Suction available and Patient being monitored Patient Re-evaluated:Patient Re-evaluated prior to induction Oxygen Delivery Method: Circle system utilized Preoxygenation: Pre-oxygenation with 100% oxygen Induction Type: IV induction Ventilation: Mask ventilation without difficulty Laryngoscope Size: Mac and 3 Grade View: Grade I Tube type: Oral Tube size: 7.5 mm Number of attempts: 1 Airway Equipment and Method: Stylet and Oral airway Placement Confirmation: ETT inserted through vocal cords under direct vision, positive ETCO2 and breath sounds checked- equal and bilateral Secured at: 20 cm Tube secured with: Tape Dental Injury: Teeth and Oropharynx as per pre-operative assessment

## 2021-06-30 NOTE — Discharge Instructions (Signed)

## 2021-07-01 ENCOUNTER — Encounter (HOSPITAL_COMMUNITY): Payer: Self-pay | Admitting: Surgery

## 2021-07-01 LAB — CBC WITH DIFFERENTIAL/PLATELET
Abs Immature Granulocytes: 0.08 10*3/uL — ABNORMAL HIGH (ref 0.00–0.07)
Basophils Absolute: 0 10*3/uL (ref 0.0–0.1)
Basophils Relative: 0 %
Eosinophils Absolute: 0 10*3/uL (ref 0.0–0.5)
Eosinophils Relative: 0 %
HCT: 36.2 % (ref 36.0–46.0)
Hemoglobin: 11.8 g/dL — ABNORMAL LOW (ref 12.0–15.0)
Immature Granulocytes: 1 %
Lymphocytes Relative: 6 %
Lymphs Abs: 0.9 10*3/uL (ref 0.7–4.0)
MCH: 23 pg — ABNORMAL LOW (ref 26.0–34.0)
MCHC: 32.6 g/dL (ref 30.0–36.0)
MCV: 70.4 fL — ABNORMAL LOW (ref 80.0–100.0)
Monocytes Absolute: 0.7 10*3/uL (ref 0.1–1.0)
Monocytes Relative: 5 %
Neutro Abs: 13.1 10*3/uL — ABNORMAL HIGH (ref 1.7–7.7)
Neutrophils Relative %: 88 %
Platelets: 233 10*3/uL (ref 150–400)
RBC: 5.14 MIL/uL — ABNORMAL HIGH (ref 3.87–5.11)
RDW: 14.5 % (ref 11.5–15.5)
WBC: 14.8 10*3/uL — ABNORMAL HIGH (ref 4.0–10.5)
nRBC: 0 % (ref 0.0–0.2)

## 2021-07-01 LAB — SURGICAL PATHOLOGY

## 2021-07-01 NOTE — Progress Notes (Addendum)
Patient alert and oriented, pain is controlled. Patient is tolerating fluids, advanced to protein shake this morning at 0920, patient is tolerating well.  Reviewed Gastric sleeve discharge instructions with patient and patient is able to articulate understanding.  Provided information on BELT program, Support Group and [facility] outpatient pharmacy. All questions answered, will continue to monitor.

## 2021-07-01 NOTE — Progress Notes (Signed)
24hr fluid recall prior to discharge: 570mL. Per dehydration protocol, will call pt to f/u within one week post op. ?

## 2021-07-01 NOTE — Progress Notes (Signed)
°  Transition of Care [facility]) Screening Note   Patient Details  Name: Deborah Norman Date of Birth: [birth year]   Transition of Care [facility]) CM/SW Contact:    Lennart Pall, LCSW Phone Number: 07/01/2021, 9:36 AM    Transition of Care Department [facility]) has reviewed patient and no TOC needs have been identified at this time. We will continue to monitor patient advancement through interdisciplinary progression rounds. If new patient transition needs arise, please place a TOC consult.

## 2021-07-01 NOTE — Progress Notes (Signed)
Pt alert and oriented. Tolerating liquids. D/C instructions given by London Pepper. Pt d/cd to home.

## 2021-07-03 ENCOUNTER — Telehealth (HOSPITAL_COMMUNITY): Payer: Self-pay | Admitting: *Deleted

## 2021-07-14 ENCOUNTER — Encounter: Payer: BC Managed Care – PPO | Attending: Surgery | Admitting: Skilled Nursing Facility1

## 2021-07-14 ENCOUNTER — Other Ambulatory Visit: Payer: Self-pay

## 2021-07-15 NOTE — Progress Notes (Signed)
2 Week Post-Operative Nutrition Class ?  ?Patient was seen on 07/14/2021 for Post-Operative Nutrition education at the [facility].  ?  ?Surgery date: 06/30/2021 ?Surgery type: sleeve ?Start weight at [facility]: 254 ?Weight today: 244.1 ?Bowel Habits: Every day to every other day no complaints ?  ?Body Composition Scale 07/14/2021  ?Current Body Weight 244.1  ?Total Body Fat % 43.8  ?Visceral Fat 13  ?Fat-Free Mass % 56.1  ? Total Body Water % 42.5  ?Muscle-Mass lbs 32.8  ?BMI 41.8  ?Body Fat Displacement   ?       Torso  lbs 66.3  ?       Left Leg  lbs 13.2  ?       Right Leg  lbs 13.2  ?       Left Arm  lbs 6.6  ?       Right Arm   lbs 6.6  ? ? ?  ?The following the learning objectives were met by the patient during this course: ?Identifies Phase 3 (Soft, High Proteins) Dietary Goals and will begin from 2 weeks post-operatively to 2 months post-operatively ?Identifies appropriate sources of fluids and proteins  ?Identifies appropriate fat sources and healthy verses unhealthy fat types   ?States protein recommendations and appropriate sources post-operatively ?Identifies the need for appropriate texture modifications, mastication, and bite sizes when consuming solids ?Identifies appropriate fat consumption and sources ?Identifies appropriate multivitamin and calcium sources post-operatively ?Describes the need for physical activity post-operatively and will follow MD recommendations ?States when to call healthcare provider regarding medication questions or post-operative complications ?  ?Handouts given during class include: ?Phase 3A: Soft, High Protein Diet Handout ?Phase 3 High Protein Meals ?Healthy Fats ?  ?Follow-Up Plan: ?Patient will follow-up at [facility] in 6 weeks for 2 month post-op nutrition visit for diet advancement per MD. ? ?

## 2021-07-20 ENCOUNTER — Telehealth: Payer: Self-pay | Admitting: Skilled Nursing Facility1

## 2021-07-20 NOTE — Telephone Encounter (Signed)
RD called pt to verify fluid intake once starting soft, solid proteins 2 week post-bariatric surgery.   Daily Fluid intake:  Daily Protein intake: Bowel Habits:   Concerns/issues:    LVM 

## 2021-08-19 ENCOUNTER — Encounter: Payer: BC Managed Care – PPO | Attending: Surgery | Admitting: Skilled Nursing Facility1

## 2021-08-19 DIAGNOSIS — Z6839 Body mass index (BMI) 39.0-39.9, adult: Secondary | ICD-10-CM | POA: Insufficient documentation

## 2021-08-19 DIAGNOSIS — I10 Essential (primary) hypertension: Secondary | ICD-10-CM | POA: Diagnosis not present

## 2021-08-19 DIAGNOSIS — Z713 Dietary counseling and surveillance: Secondary | ICD-10-CM | POA: Diagnosis not present

## 2021-08-19 NOTE — Progress Notes (Signed)
Bariatric Nutrition Follow-Up Visit ?Medical Nutrition Therapy  ? ?NUTRITION ASSESSMENT ?  ?Surgery date: 06/30/2021 ?Surgery type: sleeve ?Start weight at [facility]: 254 ?Weight today: 228.1 ? ?Body Composition Scale 07/14/2021 08/19/2021  ?Current Body Weight 244.1 228.1  ?Total Body Fat % 43.8 43.1  ?Visceral Fat 13 12  ?Fat-Free Mass % 56.1 56.8  ? Total Body Water % 42.5 42.9  ?Muscle-Mass lbs 32.8 31.5  ?BMI 41.8 39  ?Body Fat Displacement    ?       Torso  lbs 66.3 60.8  ?       Left Leg  lbs 13.2 12.1  ?       Right Leg  lbs 13.2 12.1  ?       Left Arm  lbs 6.6 6.0  ?       Right Arm   lbs 6.6 6.0  ? ?Clinical  ?Medical hx:  breast lumpectomy at age 59 years ?Medications: vitamin d, hydrochlorothiazide (3 times per week), and then as needed:  flexeril, advil, motrin, naproxen, Acetaminophen, xanax, ? ?  ?Lifestyle & Dietary Hx ? ?Pt states she is a little weirded out she just sees food as food now but is accepting of that.   ?Pt states she does feel tired a lot of the time stating she is thinking it is from not drinking stating she is thinking it is from not bringing enough water to work. Pt states she usually brings 2 bottles into work each day, but will now bring 4 into work. ?Pt states she normally wakes about 9-10:30 am.  ?Pt state she has had some acid reflux reporting 2 events.  ?Pt states her lower back feels strained.  ?Pt states she typically adds dry seasonings for flavor in her veggies.  ? ?Estimated daily fluid intake: 40 oz ?Estimated daily protein intake: 70 g ?Supplements: multi and calcium  ?Current average weekly physical activity: walking 2 days a week for 45-60 minutes  ? ?24-Hr Dietary Recall ?First Meal: boiled egg or protein shake ?Snack 10-11:15:  chicken salad cup ?Second Meal: ground [place] or chicken  ?Snack:   ?Third Meal: chicken and broccoli ?Snack:  ?Beverages: water, Gatorade zero ? ?Post-Op Goals/ Signs/ Symptoms ?Using straws: no ?Drinking while eating: no ?Chewing/swallowing  difficulties: no ?Changes in vision: no ?Changes to mood/headaches: no ?Hair loss/changes to skin/nails: no ?Difficulty focusing/concentrating: no ?Sweating: no ?Limb weakness: no ?Dizziness/lightheadedness: no ?Palpitations: no  ?Carbonated/caffeinated beverages: no ?N/V/D/C/Gas: no ?Abdominal pain: no ?Dumping syndrome: no ? ?  ?NUTRITION DIAGNOSIS  ?Overweight/obesity ([place]-3.3) related to past poor dietary habits and physical inactivity as evidenced by completed bariatric surgery and following dietary guidelines for continued weight loss and healthy nutrition status. ?  ?  ?NUTRITION INTERVENTION ?Nutrition counseling (C-1) and education (E-2) to facilitate bariatric surgery goals, including: ?Diet advancement to the next phase (phase 4) now including non starchy vegetables ?The importance of consuming adequate calories as well as certain nutrients daily due to the body's need for essential vitamins, minerals, and fats ?The importance of daily physical activity and to reach a goal of at least 150 minutes of moderate to vigorous physical activity weekly (or as directed by their physician) due to benefits such as increased musculature and improved lab values ?The importance of intuitive eating specifically learning hunger-satiety cues and understanding the importance of learning a new body: The importance of mindful eating to avoid grazing behaviors  ? ?Goals: ?-Continue to aim for a minimum of 64 fluid ounces 7 days a  week with at least 30 ounces being plain water: bring 4 bottles into to work with you  ? ?-Eat non-starchy vegetables 2 times a day 7 days a week ? ?-Start out with soft cooked vegetables today and tomorrow; if tolerated begin to eat raw vegetables or cooked including salads ? ?-Eat your 3 ounces of protein first then start in on your non-starchy vegetables; once you understand how much of your meal leads to satisfaction and not full while still eating 3 ounces of protein and non-starchy vegetables  you can eat them in any order  ? ?-Continue to aim for 30 minutes of activity at least 5 times a week ? ?-Do NOT cook with/add to your food: alfredo sauce, cheese sauce, barbeque sauce, ketchup, fat back, butter, bacon grease, grease, Crisco, OR SUGAR ? ?-switch your multi to a capsule and take later in the day ? ? ?Handouts Provided Include  ?Phase 4 ? ?Learning Style & Readiness for Change ?Teaching method utilized: Visual & Auditory  ?Demonstrated degree of understanding via: Teach Back  ?Readiness Level: action ?Barriers to learning/adherence to lifestyle change: work ? ?RD's Notes for Next Visit ?Assess adherence to pt chosen goals  ? ? ?MONITORING & EVALUATION ?Dietary intake, weekly physical activity, body weight ? ?Next Steps ?Patient is to follow-up in July 6 months class ?

## 2021-11-17 ENCOUNTER — Encounter: Payer: BC Managed Care – PPO | Attending: Surgery | Admitting: Skilled Nursing Facility1

## 2021-11-18 ENCOUNTER — Encounter: Payer: Self-pay | Admitting: Skilled Nursing Facility1

## 2021-11-18 NOTE — Progress Notes (Signed)
Follow-up visit:  Post-Operative sleeve Surgery  Medical Nutrition Therapy:  Appt start time: 6:00pm end time:  7:00pm  Primary concerns today: Post-operative Bariatric Surgery Nutrition Management 6 Month Post-Op Class NUTRITION ASSESSMENT   Surgery date: 06/30/2021 Surgery type: sleeve Start weight at [facility]: 254 Weight today: 228.1  Body Composition Scale 07/14/2021 08/19/2021  Current Body Weight 244.1 228.1  Total Body Fat % 43.8 43.1  Visceral Fat 13 12  Fat-Free Mass % 56.1 56.8   Total Body Water % 42.5 42.9  Muscle-Mass lbs 32.8 31.5  BMI 41.8 39  Body Fat Displacement           Torso  lbs 66.3 60.8         Left Leg  lbs 13.2 12.1         Right Leg  lbs 13.2 12.1         Left Arm  lbs 6.6 6.0         Right Arm   lbs 6.6 6.0   Clinical  Medical hx:  breast lumpectomy at age 13 years Medications: vitamin d, hydrochlorothiazide (3 times per week), and then as needed:  flexeril, advil, motrin, naproxen, Acetaminophen, xanax,    Information Reviewed/ Discussed During Appointment: -Review of composition scale numbers -Fluid requirements (64-100 ounces) -Protein requirements (60-80g) -Strategies for tolerating diet -Advancement of diet to include Starchy vegetables -Barriers to inclusion of new foods -Inclusion of appropriate multivitamin and calcium supplements  -Exercise recommendations   Fluid intake: adequate   Medications: See List Supplementation: appropriate    Using straws: no Drinking while eating: no Having you been chewing well: yes Chewing/swallowing difficulties: no Changes in vision: no Changes to mood/headaches: no Hair loss/Cahnges to skin/Changes to nails: no Any difficulty focusing or concentrating: no Sweating: no Dizziness/Lightheaded: no Palpitations: no  Carbonated beverages: no N/V/D/C/GAS: no Abdominal Pain: no Dumping syndrome: no  Recent physical activity:  ADL's  Progress Towards Goal(s):  In Progress Teaching method  utilized: Visual & Auditory  Demonstrated degree of understanding via: Teach Back  Readiness Level: Action Barriers to learning/adherence to lifestyle change: none identified  Handouts given during visit include: Phase V diet Progression  Goals Sheet The Benefits of Exercise are endless..... Support Group Topics   Teaching Method Utilized:  Visual Auditory Hands on  Demonstrated degree of understanding via:  Teach Back   Monitoring/Evaluation:  Dietary intake, exercise, and body weight. Follow up in 3 months for 9 month post-op visit.

## 2022-02-16 ENCOUNTER — Ambulatory Visit: Payer: BC Managed Care – PPO | Admitting: Skilled Nursing Facility1

## 2022-05-14 ENCOUNTER — Encounter (HOSPITAL_COMMUNITY): Payer: Self-pay | Admitting: *Deleted

## 2023-01-24 ENCOUNTER — Encounter (HOSPITAL_COMMUNITY): Payer: Self-pay | Admitting: *Deleted

## 2024-01-27 ENCOUNTER — Encounter (HOSPITAL_COMMUNITY): Payer: Self-pay | Admitting: *Deleted
